# Patient Record
Sex: Female | Born: 1985
Health system: Southern US, Community
[De-identification: ages and names within clinical notes are randomized; demographics above are authoritative.]

## PROBLEM LIST (undated history)

## (undated) ENCOUNTER — Inpatient Hospital Stay (HOSPITAL_COMMUNITY): Payer: 59

## (undated) DIAGNOSIS — R519 Headache, unspecified: Secondary | ICD-10-CM

## (undated) DIAGNOSIS — E559 Vitamin D deficiency, unspecified: Secondary | ICD-10-CM

## (undated) DIAGNOSIS — R51 Headache: Secondary | ICD-10-CM

## (undated) DIAGNOSIS — E039 Hypothyroidism, unspecified: Secondary | ICD-10-CM

## (undated) DIAGNOSIS — R253 Fasciculation: Secondary | ICD-10-CM

## (undated) HISTORY — DX: Hypothyroidism, unspecified: E03.9

## (undated) HISTORY — DX: Headache: R51

## (undated) HISTORY — DX: Fasciculation: R25.3

## (undated) HISTORY — DX: Vitamin D deficiency, unspecified: E55.9

## (undated) HISTORY — DX: Headache, unspecified: R51.9

## (undated) HISTORY — PX: OTHER SURGICAL HISTORY: SHX169

---

## 2012-09-04 ENCOUNTER — Encounter (HOSPITAL_COMMUNITY): Payer: Self-pay | Admitting: Emergency Medicine

## 2012-09-04 ENCOUNTER — Emergency Department (INDEPENDENT_AMBULATORY_CARE_PROVIDER_SITE_OTHER)
Admission: EM | Admit: 2012-09-04 | Discharge: 2012-09-04 | Disposition: A | Discharge status: Home / Self Care | Payer: 59 | Source: Home / Self Care | Attending: Family Medicine | Admitting: Family Medicine

## 2012-09-04 DIAGNOSIS — J069 Acute upper respiratory infection, unspecified: Secondary | ICD-10-CM

## 2012-09-04 MED ORDER — HYDROCOD POLST-CHLORPHEN POLST 10-8 MG/5ML PO LQCR: 5.0000 mL | Freq: Two times a day (BID) | ORAL | Status: DC

## 2012-09-04 MED ORDER — AZITHROMYCIN 250 MG PO TABS: ORAL_TABLET | ORAL | Status: DC

## 2012-09-04 NOTE — ED Provider Notes (Signed)
History     CSN: [id number]  Arrival date & time 09/04/12  1925   First MD Initiated Contact with Patient 09/04/12 1929      Chief Complaint  Patient presents with  . URI    (Consider location/radiation/quality/duration/timing/severity/associated sxs/prior treatment) Patient is a 26 y.o. female presenting with URI. The history is provided by the patient.  URI The primary symptoms include cough. Primary symptoms do not include fever, fatigue, sore throat, swollen glands or abdominal pain. The current episode started more than 1 week ago. This is a new problem. The problem has not changed since onset. The onset of the illness is associated with exposure to sick contacts. Symptoms associated with the illness include congestion and rhinorrhea.    No past medical history on file.  History reviewed. No pertinent past surgical history.  No family history on file.  History  Substance Use Topics  . Smoking status: Not on file  . Smokeless tobacco: Not on file  . Alcohol Use: Not on file    OB History    Grav Para Term Preterm Abortions TAB SAB Ect Mult Living                  Review of Systems  Constitutional: Negative.  Negative for fever and fatigue.  HENT: Positive for congestion and rhinorrhea. Negative for sore throat and trouble swallowing.   Respiratory: Positive for cough.   Gastrointestinal: Negative.  Negative for abdominal pain.  Skin: Negative.     Allergies  Peanuts  Home Medications   Current Outpatient Rx  Name  Route  Sig  Dispense  Refill  . AZITHROMYCIN 250 MG PO TABS      Take as directed on pack   6 each   0   . HYDROCOD POLST-CPM POLST ER 10-8 MG/5ML PO LQCR   Oral   Take 5 mLs by mouth every 12 (twelve) hours.   115 mL   0     BP 137/70  Pulse 99  Temp 98.9 F (37.2 C) (Oral)  Resp 16  SpO2 99%  Physical Exam  Nursing note and vitals reviewed. Constitutional: She is oriented to person, place, and time. She appears  well-developed and well-nourished.  HENT:  Head: Normocephalic.  Right Ear: External ear normal.  Left Ear: External ear normal.  Mouth/Throat: Oropharynx is clear and moist.  Eyes: Pupils are equal, round, and reactive to light.  Neck: Normal range of motion. Neck supple.  Cardiovascular: Normal rate, regular rhythm, normal heart sounds and intact distal pulses.   Pulmonary/Chest: Breath sounds normal.  Lymphadenopathy:    She has no cervical adenopathy.  Neurological: She is alert and oriented to person, place, and time.  Skin: Skin is warm and dry.    ED Course  Procedures (including critical care time)  Labs Reviewed - No data to display No results found.   1. URI (upper respiratory infection)       MDM          Linna Hoff, MD 09/04/12 2004

## 2012-09-04 NOTE — ED Notes (Signed)
Pt c/o cough, stuffy nose, yellow mucus, weakness and night sweats.

## 2013-06-02 ENCOUNTER — Emergency Department (HOSPITAL_COMMUNITY)
Admission: EM | Admit: 2013-06-02 | Discharge: 2013-06-03 | Disposition: A | Discharge status: Home / Self Care | Payer: 59 | Attending: Emergency Medicine | Admitting: Emergency Medicine

## 2013-06-02 ENCOUNTER — Encounter (HOSPITAL_COMMUNITY): Payer: Self-pay

## 2013-06-02 DIAGNOSIS — R209 Unspecified disturbances of skin sensation: Secondary | ICD-10-CM | POA: Insufficient documentation

## 2013-06-02 DIAGNOSIS — Y9389 Activity, other specified: Secondary | ICD-10-CM | POA: Insufficient documentation

## 2013-06-02 DIAGNOSIS — Y929 Unspecified place or not applicable: Secondary | ICD-10-CM | POA: Insufficient documentation

## 2013-06-02 DIAGNOSIS — J069 Acute upper respiratory infection, unspecified: Secondary | ICD-10-CM | POA: Insufficient documentation

## 2013-06-02 DIAGNOSIS — W268XXA Contact with other sharp object(s), not elsewhere classified, initial encounter: Secondary | ICD-10-CM | POA: Insufficient documentation

## 2013-06-02 DIAGNOSIS — Z79899 Other long term (current) drug therapy: Secondary | ICD-10-CM | POA: Insufficient documentation

## 2013-06-02 DIAGNOSIS — S61211A Laceration without foreign body of left index finger without damage to nail, initial encounter: Secondary | ICD-10-CM

## 2013-06-02 DIAGNOSIS — S61209A Unspecified open wound of unspecified finger without damage to nail, initial encounter: Secondary | ICD-10-CM | POA: Insufficient documentation

## 2013-06-02 NOTE — ED Notes (Addendum)
i inch laceration left hand index laceration accidental. Bleeding controlled.  Pt also c/o of cold for 1 week been on nyquil for 1 week. Denies fever

## 2013-06-02 NOTE — ED Provider Notes (Signed)
History  This chart was scribed for non-physician practitioner, Katie  working with Vida Roller, MD by Ardeen Jourdain, ED Scribe. This patient was seen in room WTR8/WTR8 and the patient's care was started at 2257.  CSN: [id number]     Arrival date & time 06/02/13  2225  None    Chief Complaint  Patient presents with  . Laceration    left hand index finger   The history is provided by the patient. No language interpreter was used.    HPI Comments: Megan Hall is a 27 y.o. female who presents to the Emergency Department complaining of a laceration to the left index finger. Pt states she put her blender in the sink to wash it when she accidentally cut herself with the blade. She reports mild numbness and a "cold" sensation after the accident; both of which have resolved. She denies any weakness, numbness and tingling as associated symptoms. She denies any other injuries. Bleeding is controlled at this time.  Tetanus up to date.    History reviewed. No pertinent past medical history. History reviewed. No pertinent past surgical history. No family history on file.  History  Substance Use Topics  . Smoking status: Not on file  . Smokeless tobacco: Not on file  . Alcohol Use: No   No OB history available.   Review of Systems  All other systems reviewed and are negative.    Allergies  Peanuts  Home Medications   Current Outpatient Rx  Name  Route  Sig  Dispense  Refill  . PRESCRIPTION MEDICATION   Oral   Take 1 tablet by mouth daily. Oral Contraceptive tablet (pt unsure which one)         . Pseudoeph-Doxylamine-DM-APAP 60-12.03-22-999 MG/30ML LIQD   Oral   Take 30 mLs by mouth every 6 (six) hours as needed (for cold symptoms).          Triage Vitals: BP 132/78  Pulse 96  Temp(Src) 98.2 F (36.8 C) (Oral)  Resp 17  Ht 5\' 8"  (1.727 m)  Wt 160 lb (72.576 kg)  BMI 24.33 kg/m2  SpO2 99%  LMP 05/04/2013  Physical Exam  Musculoskeletal:  1.5cm  horizontal, subq, bleeding lac just distal to DIP joint on medial surface of L index finger.  No foreign body.  Mildly ttp.  Distal sensation intact.  5/5 flexion/extension strength.     ED Course   Procedures (including critical care time)  LACERATION REPAIR Performed by: Otilio Miu Authorized by: Ruby Cola E Consent: Verbal consent obtained. Risks and benefits: risks, benefits and alternatives were discussed Consent given by: patient Patient identity confirmed: provided demographic data Prepped and Draped in normal sterile fashion Wound explored  Laceration Location: L index finger  Laceration Length: 1.5cm  No Foreign Bodies seen or palpated  Anesthesia: digital block  Local anesthetic: lidocaine 2% w/out epinephrine  Anesthetic total: 4 ml  Irrigation method: syringe Amount of cleaning: standard  Skin closure: prolene 4.0  Number of sutures: 3  Technique: simple interrupted  Patient tolerance: Patient tolerated the procedure well with no immediate complications.  DIAGNOSTIC STUDIES: Oxygen Saturation is 99% on room air, normal by my interpretation.    COORDINATION OF CARE:  11:31 PM-Discussed treatment plan which includes laceration repair with pt at bedside and pt agreed to plan.    Labs Reviewed - No data to display No results found. 1. Laceration of left index finger w/o foreign body w/o damage to nail, initial encounter  MDM  26yo healthy F presents w/ lac to L index finger.  Cleaned and sutured.  Tetanus up to date.  Signs of wound infection discussed.  12:01 AM    I personally performed the services described in this documentation, which was scribed in my presence. The recorded information has been reviewed and is accurate.    Otilio Miu, PA-C 06/03/13 0002

## 2013-06-03 NOTE — ED Notes (Signed)
MD at bedside. EDPA has returned to evaluate pt for cold like symptoms VSS. Pt reports symptoms have improved. Offerred HHN- Pt declined at this time.

## 2013-06-03 NOTE — ED Provider Notes (Signed)
Medical screening examination/treatment/procedure(s) were performed by non-physician practitioner and as supervising physician I was immediately available for consultation/collaboration.    Vida Roller, MD 06/03/13 [phone]

## 2013-07-11 ENCOUNTER — Other Ambulatory Visit (HOSPITAL_COMMUNITY): Payer: Self-pay | Admitting: Obstetrics and Gynecology

## 2013-07-11 ENCOUNTER — Ambulatory Visit (HOSPITAL_COMMUNITY)
Admission: RE | Admit: 2013-07-11 | Discharge: 2013-07-11 | Disposition: A | Discharge status: Home / Self Care | Payer: 59 | Source: Ambulatory Visit | Attending: Obstetrics and Gynecology | Admitting: Obstetrics and Gynecology

## 2013-07-11 DIAGNOSIS — R51 Headache: Secondary | ICD-10-CM | POA: Insufficient documentation

## 2013-07-11 DIAGNOSIS — R519 Headache, unspecified: Secondary | ICD-10-CM

## 2013-09-30 ENCOUNTER — Other Ambulatory Visit: Payer: Self-pay | Admitting: Family Medicine

## 2013-09-30 DIAGNOSIS — E049 Nontoxic goiter, unspecified: Secondary | ICD-10-CM

## 2013-10-02 ENCOUNTER — Ambulatory Visit
Admission: RE | Admit: 2013-10-02 | Discharge: 2013-10-02 | Disposition: A | Discharge status: Home / Self Care | Payer: 59 | Source: Ambulatory Visit | Attending: Family Medicine | Admitting: Family Medicine

## 2013-10-02 DIAGNOSIS — E049 Nontoxic goiter, unspecified: Secondary | ICD-10-CM

## 2013-10-04 ENCOUNTER — Encounter: Payer: Self-pay | Admitting: Neurology

## 2013-10-04 ENCOUNTER — Ambulatory Visit: Payer: 59 | Admitting: Neurology

## 2013-10-22 ENCOUNTER — Other Ambulatory Visit: Payer: Self-pay | Admitting: Family Medicine

## 2013-10-22 DIAGNOSIS — E041 Nontoxic single thyroid nodule: Secondary | ICD-10-CM

## 2013-10-25 ENCOUNTER — Encounter (HOSPITAL_COMMUNITY): Payer: Self-pay | Admitting: Emergency Medicine

## 2013-10-25 ENCOUNTER — Emergency Department (HOSPITAL_COMMUNITY)
Admission: EM | Admit: 2013-10-25 | Discharge: 2013-10-25 | Disposition: A | Discharge status: Home / Self Care | Payer: 59 | Attending: Emergency Medicine | Admitting: Emergency Medicine

## 2013-10-25 DIAGNOSIS — R5383 Other fatigue: Secondary | ICD-10-CM

## 2013-10-25 DIAGNOSIS — R519 Headache, unspecified: Secondary | ICD-10-CM

## 2013-10-25 DIAGNOSIS — E041 Nontoxic single thyroid nodule: Secondary | ICD-10-CM | POA: Insufficient documentation

## 2013-10-25 DIAGNOSIS — R5381 Other malaise: Secondary | ICD-10-CM | POA: Insufficient documentation

## 2013-10-25 DIAGNOSIS — R200 Anesthesia of skin: Secondary | ICD-10-CM

## 2013-10-25 DIAGNOSIS — R6884 Jaw pain: Secondary | ICD-10-CM | POA: Insufficient documentation

## 2013-10-25 DIAGNOSIS — R209 Unspecified disturbances of skin sensation: Secondary | ICD-10-CM | POA: Insufficient documentation

## 2013-10-25 DIAGNOSIS — F411 Generalized anxiety disorder: Secondary | ICD-10-CM | POA: Insufficient documentation

## 2013-10-25 DIAGNOSIS — R51 Headache: Secondary | ICD-10-CM | POA: Insufficient documentation

## 2013-10-25 MED ORDER — HYDROCODONE-ACETAMINOPHEN 5-325 MG PO TABS: ORAL_TABLET | ORAL | Status: DC

## 2013-10-25 NOTE — Progress Notes (Signed)
   CARE MANAGEMENT ED NOTE 10/25/2013  Patient:  Megan Hall,Megan Hall   Account Number:  [id number]  Date Initiated:  10/25/2013  Documentation initiated by:  Edd ArbourGIBBS,KIMBERLY  Subjective/Objective Assessment:   28 yr [facility] with c/o right side facial numbness going on for months, comes and goes increases s/s last night and this am including arm weakness Pt states pcp is Vyvyan sun     Subjective/Objective Assessment Detail:     Action/Plan:   Action/Plan Detail:   EPIC updated   Anticipated DC Date:       Status Recommendation to Physician:   Result of Recommendation:    Other ED Services  Consult Working Plan    DC Planning Services  Other  PCP issues    Choice offered to / List presented to:            Status of service:  Completed, signed off  ED Comments:   ED Comments Detail:

## 2013-10-25 NOTE — ED Notes (Signed)
Pt states that she has had intermittent facial numbness and right weakness/numbness for several months her PCP is currently running test thinking this is thyroid related. Pt states last night when she was trying to go to sleep she was having right face and arm numbness that has continued (intermittently) on to this morning and she was aggravated with arm weakness and not able to go to work.

## 2013-10-25 NOTE — ED Provider Notes (Signed)
CSN: [id number]     Arrival date & time 10/25/13  1107 History   First MD Initiated Contact with Patient 10/25/13 1118     Chief Complaint  Patient presents with  . facial numbness    (Consider location/radiation/quality/duration/timing/severity/associated sxs/prior Treatment) HPI Pt is a 28yo female with no significant PMH presenting with right facial numbness and right arm numbness x38mo.  Pt has also had associated weakness in right arm. Today, pt has right facial numbness, right jaw pain that is aching 7/10, right arm numbness and weakness. She has not taken any pain medications as she is scared to take most medications.  Symptoms have been intermittent and started shortly after pt was placed on a new birth control by her GYN.  Pt states when symptoms initially started, she was also having severe headaches.  Her GYN ordered a head CT at that time which was normal per pt.  She is not being followed by her PCP for symptoms.  PCP believes it is due to her thyroid as pt has also c/o fatigue.  Per pt, blood work for her thyroid was also normal but they found nodules on her thyroid, she is scheduled for a biopsy of the nodules.  PCP also referred pt to neurology however pt states she skipped her appointment last week as she thought her symptoms would "just go away."  Denies fever, n/v/d. Denies known injury. Pt does admit to using [employer] to look up her symptoms and was concerned her symptoms are due to something dangerous.   History reviewed. No pertinent past medical history. History reviewed. No pertinent past surgical history. No family history on file. History  Substance Use Topics  . Smoking status: Never Smoker   . Smokeless tobacco: Not on file  . Alcohol Use: No   OB History   Grav Para Term Preterm Abortions TAB SAB Ect Mult Living                 Review of Systems  Constitutional: Negative for fever and chills.  Gastrointestinal: Positive for nausea. Negative for vomiting.   Neurological: Positive for weakness ( right arm), numbness ( right side of face, right arm) and headaches. Negative for dizziness and light-headedness.  All other systems reviewed and are negative.    Allergies  Peanuts  Home Medications   Current Outpatient Rx  Name  Route  Sig  Dispense  Refill  . HYDROcodone-acetaminophen (NORCO/VICODIN) 5-325 MG per tablet      Take 1-2 tablets every 4-6 hours as needed for pain.   6 tablet   0    BP 127/69  Pulse 89  Temp(Src) 97.9 F (36.6 C) (Oral)  Resp 19  SpO2 100%  LMP 10/24/2013 Physical Exam  Nursing note and vitals reviewed. Constitutional: She is oriented to person, place, and time. She appears well-developed and well-nourished. No distress.  Pt lying comfortably in exam bed, NAD.   HENT:  Head: Normocephalic and atraumatic.  Mouth/Throat: Oropharynx is clear and moist. No oropharyngeal exudate.  Eyes: Conjunctivae and EOM are normal. Pupils are equal, round, and reactive to light. No scleral icterus.  Neck: Normal range of motion. Neck supple.  Cardiovascular: Normal rate, regular rhythm and normal heart sounds.   Pulmonary/Chest: Effort normal and breath sounds normal. No respiratory distress. She has no wheezes. She has no rales. She exhibits no tenderness.  Abdominal: Soft. Bowel sounds are normal. She exhibits no distension and no mass. There is no tenderness. There is no rebound and  no guarding.  Musculoskeletal: Normal range of motion. She exhibits no edema and no tenderness.  Neurological: She is alert and oriented to person, place, and time. She has normal strength. No cranial nerve deficit or sensory deficit. She displays a negative Romberg sign. Coordination and gait normal. GCS eye subscore is 4. GCS verbal subscore is 5. GCS motor subscore is 6.  CN II-XII in tact, no focal deficit, nl finger to nose coordination. Nl sensation, 5/5 strength in all major muscle groups. Neg romberg and nl gait.  Skin: Skin is warm  and dry. She is not diaphoretic.  Psychiatric: Her mood appears anxious.    ED Course  Procedures (including critical care time) Labs Review Labs Reviewed - No data to display Imaging Review No results found.  EKG Interpretation   None       MDM   1. Right facial numbness   2. Right arm numbness   3. Headache    Pt c/o intermittent right facial numbness and weakness. Pt currently being followed by PCP for possible thyroid related problems.  No significant PMH.  Pt has not f/u with neurology as she believed symptoms would go away. Denies fever, n/v/d. Denies known injury. On exam, pt appears well, NAD. Normal neuro exam. No evidence of right sided weakness.  Pt initially declined pain medication but requested pain medication to go home with, Rx: norco.   Not concerned for CVA, SAH, TIA unlikely as pt has been followed by 2 other providers over last 38mo, reports negative head CT 38mo ago.  Do not believe emergent process taking place at this time. Advised pt to f/u with neurology as previously discussed with her PCP.  Provided another referral today to [facility].  Discussed return precautions.  Pt verbalized understanding and agreement with tx plan.    Junius FinnerErin O'Malley, PA-C 10/26/13 [phone]

## 2013-10-28 NOTE — ED Provider Notes (Signed)
Medical screening examination/treatment/procedure(s) were performed by non-physician practitioner and as supervising physician I was immediately available for consultation/collaboration.  EKG Interpretation   None         Jagger Beahm L Tobie Hellen, MD 10/28/13 0734 

## 2013-11-05 ENCOUNTER — Inpatient Hospital Stay
Admission: RE | Admit: 2013-11-05 | Discharge: 2013-11-05 | Disposition: A | Discharge status: Home / Self Care | Payer: 59 | Source: Ambulatory Visit | Attending: Family Medicine | Admitting: Family Medicine

## 2013-11-13 ENCOUNTER — Inpatient Hospital Stay: Admission: RE | Admit: 2013-11-13 | Payer: 59 | Source: Ambulatory Visit

## 2013-11-20 ENCOUNTER — Other Ambulatory Visit (HOSPITAL_COMMUNITY)
Admission: RE | Admit: 2013-11-20 | Discharge: 2013-11-20 | Disposition: A | Discharge status: Home / Self Care | Payer: 59 | Source: Ambulatory Visit | Attending: Interventional Radiology | Admitting: Interventional Radiology

## 2013-11-20 ENCOUNTER — Ambulatory Visit
Admission: RE | Admit: 2013-11-20 | Discharge: 2013-11-20 | Disposition: A | Discharge status: Home / Self Care | Payer: 59 | Source: Ambulatory Visit | Attending: Family Medicine | Admitting: Family Medicine

## 2013-11-20 DIAGNOSIS — E049 Nontoxic goiter, unspecified: Secondary | ICD-10-CM | POA: Insufficient documentation

## 2013-11-20 DIAGNOSIS — E041 Nontoxic single thyroid nodule: Secondary | ICD-10-CM

## 2013-12-02 ENCOUNTER — Ambulatory Visit: Payer: 59 | Admitting: Neurology

## 2013-12-17 ENCOUNTER — Encounter: Payer: Self-pay | Admitting: Neurology

## 2013-12-17 DIAGNOSIS — E559 Vitamin D deficiency, unspecified: Secondary | ICD-10-CM

## 2013-12-18 ENCOUNTER — Encounter: Payer: Self-pay | Admitting: Neurology

## 2013-12-18 ENCOUNTER — Ambulatory Visit (INDEPENDENT_AMBULATORY_CARE_PROVIDER_SITE_OTHER): Payer: 59 | Admitting: Neurology

## 2013-12-18 ENCOUNTER — Encounter (INDEPENDENT_AMBULATORY_CARE_PROVIDER_SITE_OTHER): Payer: Self-pay

## 2013-12-18 VITALS — BP 113/78 | HR 82 | Ht 68.0 in | Wt 172.0 lb

## 2013-12-18 DIAGNOSIS — G518 Other disorders of facial nerve: Secondary | ICD-10-CM

## 2013-12-18 DIAGNOSIS — R253 Fasciculation: Secondary | ICD-10-CM

## 2013-12-18 DIAGNOSIS — G5139 Clonic hemifacial spasm, unspecified: Secondary | ICD-10-CM | POA: Insufficient documentation

## 2013-12-18 DIAGNOSIS — R259 Unspecified abnormal involuntary movements: Secondary | ICD-10-CM

## 2013-12-18 NOTE — Progress Notes (Signed)
PATIENT: Megan Hall DOB: 01-10-1986  HISTORICAL  Megan Hall is a 28 years old [place] female, referred by her primary care physician Dr. Wynelle Link for evaluation of facial muscle spasm,  Over past 1 year, without clear trigger event, she noticed right more than left eye twitching, its induced when she trying to close her eyes, she has no involuntary muscle twitching otherwise, over the years, it is getting worse, she work at [employer], is causing mild difficulty for her, she also felt heavy of her right face, she denies dysarthria, no dysphagia, no visual loss, no hearing loss, sometimes she felt right ear fullness sensation,  In addition she felt intermittent muscle twitching involving her limb muscles, no weakness, she is concerned about the possibility of multiple sclerosis, ALS with her symptoms,     Laboratory evaluation in December third 2014 showed normal BMP, TSH, vitamin D,   ultrasound of thyroid in December  2014 showed right lower lobe complex mixed cystic and solid lesions, she underwent thyroid complex nodule needle aspiration in November 20 2012 was told that was a benign lesion   REVIEW OF SYSTEMS: Full 14 system review of systems performed and notable only for confusion, headache, numbness, weakness  ALLERGIES: Allergies  Allergen Reactions  . Peanuts [Peanut Oil] Swelling    HOME MEDICATIONS: Current Outpatient Prescriptions on File Prior to Visit  Medication Sig Dispense Refill  . HYDROcodone-acetaminophen (NORCO/VICODIN) 5-325 MG per tablet Take 1-2 tablets every 4-6 hours as needed for pain.  6 tablet  0     PAST MEDICAL HISTORY: Past Medical History  Diagnosis Date  . Hypothyroidism   . Vitamin D deficiency   . HA (headache)     PAST SURGICAL HISTORY: Past Surgical History  Procedure Laterality Date  . None      FAMILY HISTORY: Family History  Problem Relation Age of Onset  . High blood pressure Mother     SOCIAL  HISTORY:  History   Social History  . Marital Status: Single    Spouse Name: N/A    Number of Children: 0  . Years of Education: 13   Occupational History    [employer]   Social History Main Topics  . Smoking status: Never Smoker   . Smokeless tobacco: Never Used  . Alcohol Use: 0.6 oz/week    1 Glasses of wine per week     Comment: OCC   . Drug Use: No  . Sexual Activity: Not on file   Other Topics Concern  . Not on file   Social History Narrative   Patient lives at home alone single. Patient works full time at [employer].   Left handed   Education some college   Caffeine none    PHYSICAL EXAM   Filed Vitals:   12/18/13 0934  BP: 113/78  Pulse: 82  Height: 5\' 8"  (1.727 m)  Weight: 172 lb (78.019 kg)   Body mass index is 26.16 kg/(m^2).   Generalized: In no acute distress  Neck: Supple, no carotid bruits   Cardiac: Regular rate rhythm  Pulmonary: Clear to auscultation bilaterally  Musculoskeletal: No deformity  Neurological examination  Mentation: Alert oriented to time, place, history taking, and causual conversation  Cranial nerve II-XII: Pupils were equal round reactive to light. Extraocular movements were full.  Visual field were full on confrontational test. Bilateral fundi were sharp.  Facial sensation and strength were normal. Hearing was intact to finger rubbing bilaterally. Uvula tongue midline.  Head turning and shoulder shrug and were normal and symmetric.Tongue protrusion into cheek strength was normal.  When she closed her eyes, there was orbicularis oculi muscle spasm  Motor: Normal tone, bulk and strength.  Sensory: Intact to fine touch, pinprick, preserved vibratory sensation, and proprioception at toes.  Coordination: Normal finger to nose, heel-to-shin bilaterally there was no truncal ataxia  Gait: Rising up from seated position without assistance, normal stance, without trunk ataxia, moderate stride, good arm swing, smooth  turning, able to perform tiptoe, and heel walking without difficulty.   Romberg signs: Negative  Deep tendon reflexes: Brachioradialis 2/2, biceps 2/2, triceps 2/2, patellar 2/2, Achilles 2/2, plantar responses were flexor bilaterally.   DIAGNOSTIC DATA (LABS, IMAGING, TESTING) - I reviewed patient records, labs, notes, testing and imaging myself where available.   ASSESSMENT AND PLAN  Megan Hall is a 28 y.o. female complains of  a year history of movements induced bilateral facial muscle spasm, right worse than left she also complains of random limb muscle fasciculations, essentially normal neurological examination, including normal reflexes, no weakness,  1. most consistent with benign muscle fasciculation, 2. but patient concerned about the possibility of multiple sclerosis, ALS, she wants to proceed with thorough extensive evaluation, we will proceed with MRI of the brain with and without contrast, EMG nerve conduction study     Levert FeinsteinYijun Sanjiv Castorena, M.D. Ph.D.  [facility] [address], [address] [place], [place] [address] [phone]

## 2013-12-25 ENCOUNTER — Other Ambulatory Visit: Payer: 59

## 2013-12-31 ENCOUNTER — Encounter: Payer: 59 | Admitting: Neurology

## 2016-05-30 ENCOUNTER — Emergency Department (HOSPITAL_COMMUNITY)
Admission: EM | Admit: 2016-05-30 | Discharge: 2016-05-30 | Disposition: A | Discharge status: Home / Self Care | Payer: 59 | Attending: Emergency Medicine | Admitting: Emergency Medicine

## 2016-05-30 ENCOUNTER — Encounter (HOSPITAL_COMMUNITY): Payer: Self-pay | Admitting: Emergency Medicine

## 2016-05-30 ENCOUNTER — Emergency Department (HOSPITAL_COMMUNITY): Payer: 59

## 2016-05-30 DIAGNOSIS — R072 Precordial pain: Secondary | ICD-10-CM | POA: Diagnosis not present

## 2016-05-30 DIAGNOSIS — R0789 Other chest pain: Secondary | ICD-10-CM

## 2016-05-30 DIAGNOSIS — Z79899 Other long term (current) drug therapy: Secondary | ICD-10-CM | POA: Insufficient documentation

## 2016-05-30 DIAGNOSIS — E039 Hypothyroidism, unspecified: Secondary | ICD-10-CM | POA: Insufficient documentation

## 2016-05-30 DIAGNOSIS — Z7982 Long term (current) use of aspirin: Secondary | ICD-10-CM | POA: Insufficient documentation

## 2016-05-30 LAB — CBC
HEMATOCRIT: 36.3 % (ref 36.0–46.0)
Hemoglobin: 12.1 g/dL (ref 12.0–15.0)
MCH: 29.9 pg (ref 26.0–34.0)
MCHC: 33.3 g/dL (ref 30.0–36.0)
MCV: 89.6 fL (ref 78.0–100.0)
PLATELETS: 289 10*3/uL (ref 150–400)
RBC: 4.05 MIL/uL (ref 3.87–5.11)
RDW: 14.7 % (ref 11.5–15.5)
WBC: 6.5 10*3/uL (ref 4.0–10.5)

## 2016-05-30 LAB — BASIC METABOLIC PANEL
Anion gap: 6 (ref 5–15)
BUN: 9 mg/dL (ref 6–20)
CHLORIDE: 103 mmol/L (ref 101–111)
CO2: 27 mmol/L (ref 22–32)
CREATININE: 0.76 mg/dL (ref 0.44–1.00)
Calcium: 9.3 mg/dL (ref 8.9–10.3)
GFR calc Af Amer: >60 mL/min (ref >60)
GFR calc non Af Amer: >60 mL/min (ref >60)
GLUCOSE: 93 mg/dL (ref 65–99)
POTASSIUM: 4.3 mmol/L (ref 3.5–5.1)
SODIUM: 136 mmol/L (ref 135–145)

## 2016-05-30 LAB — I-STAT TROPONIN, ED: Troponin i, poc: 0 ng/mL (ref 0.00–0.08)

## 2016-05-30 NOTE — ED Notes (Signed)
No answer x1

## 2016-05-30 NOTE — ED Provider Notes (Signed)
[facility]-EMERGENCY DEPT Provider Note   CSN: [id number] Arrival date & time: 05/30/16  1103  First Provider Contact:  First MD Initiated Contact with Patient 05/30/16 1309        History   Chief Complaint Chief Complaint  Patient presents with  . Chest Pain    HPI Megan Hall is a 30 y.o. female.  Patient c/o pain across upper chest for the past 4-5 days. States occurs at rest. Lasts 1-2 seconds per episode. No more prolonged or persistent chest pain. No exertional cp. No associated sob, nv or diaphoresis. Rare, non prod cough. No other uri c/o. No fever or chills. No leg pain or swelling. No chest wall injury/strain, although states did recently start working out.  No immobility, trauma, surgery. No hx dvt or pe. No fam hx premature cad. Non smoker. No palpitations.  Patient does note recent death of aunt in [place], and stress related to that, and not being able to attend funeral.      The history is provided by the patient.  Chest Pain   Pertinent negatives include no abdominal pain, no back pain, no fever, no headaches, no palpitations, no shortness of breath and no vomiting.    Past Medical History:  Diagnosis Date  . HA (headache)   . Hypothyroidism   . Vitamin D deficiency     Patient Active Problem List   Diagnosis Date Noted  . Fasciculations 12/18/2013  . Facial spasm 12/18/2013  . Vitamin D deficiency     Past Surgical History:  Procedure Laterality Date  . None      OB History    No data available       Home Medications    Prior to Admission medications   Medication Sig Start Date End Date Taking? Authorizing Provider  aspirin EC 81 MG tablet Take 81 mg by mouth once.   Yes Historical Provider, MD  Cholecalciferol (VITAMIN D PO) Take 3 drops by mouth 3 (three) times a week.   Yes Historical Provider, MD  FOLIC ACID-B6-B12-D PO Take by mouth 3 (three) times a week. A dropper full three times weekly.   Yes Historical Provider, MD  Multiple  Vitamins-Minerals (MULTIVITAMINS/MINERALS ADULT) LIQD Take by mouth 3 (three) times a week. Take one capful three times weekly.   Yes Historical Provider, MD  simethicone (MYLICON) 125 MG chewable tablet Chew 125 mg by mouth every 6 (six) hours as needed for flatulence.   Yes Historical Provider, MD  HYDROcodone-acetaminophen (NORCO/VICODIN) 5-325 MG per tablet Take 1-2 tablets every 4-6 hours as needed for pain. Patient not taking: Reported on 05/30/2016 10/25/13   Junius Finner, PA-C    Family History Family History  Problem Relation Age of Onset  . High blood pressure Mother     Social History Social History  Substance Use Topics  . Smoking status: Never Smoker  . Smokeless tobacco: Never Used  . Alcohol use 0.6 oz/week    1 Glasses of wine per week     Comment: OCC      Allergies   Peanuts [peanut oil]   Review of Systems Review of Systems  Constitutional: Negative for chills and fever.  HENT: Negative for sore throat.   Eyes: Negative for redness.  Respiratory: Negative for shortness of breath.   Cardiovascular: Positive for chest pain. Negative for palpitations and leg swelling.  Gastrointestinal: Negative for abdominal pain and vomiting.  Genitourinary: Negative for flank pain.  Musculoskeletal: Negative for back pain and neck pain.  Skin: Negative for rash.  Neurological: Negative for headaches.  Hematological: Does not bruise/bleed easily.  Psychiatric/Behavioral: Negative for confusion.     Physical Exam Updated Vital Signs BP 118/91   Pulse 87   Temp 98.3 F (36.8 C) (Oral)   Resp 15   Ht  (1.727 m)   Wt 89.8 kg   LMP 05/16/2016   SpO2 100%   BMI 30.11 kg/m   Physical Exam  Constitutional: She appears well-developed and well-nourished. No distress.  HENT:  Head: Atraumatic.  Eyes: Conjunctivae are normal. No scleral icterus.  Neck: Neck supple. No tracheal deviation present.  Cardiovascular: Normal rate, regular rhythm, normal heart sounds  and intact distal pulses.  Exam reveals no gallop and no friction rub.   No murmur heard. Pulmonary/Chest: Effort normal. No respiratory distress. She exhibits tenderness.  Abdominal: Soft. Normal appearance. She exhibits no distension. There is no tenderness.  Musculoskeletal: She exhibits no edema or tenderness.  Neurological: She is alert.  Skin: Skin is warm and dry. No rash noted.  Psychiatric: She has a normal mood and affect.  Nursing note and vitals reviewed.    ED Treatments / Results  Labs (all labs ordered are listed, but only abnormal results are displayed)  Results for orders placed or performed during the hospital encounter of 05/30/16  Basic metabolic panel  Result Value Ref Range   Sodium 136 135 - 145 mmol/L   Potassium 4.3 3.5 - 5.1 mmol/L   Chloride 103 101 - 111 mmol/L   CO2 27 22 - 32 mmol/L   Glucose, Bld 93 65 - 99 mg/dL   BUN 9 6 - 20 mg/dL   Creatinine, Ser 1.61 0.44 - 1.00 mg/dL   Calcium 9.3 8.9 - 09.6 mg/dL   GFR calc non Af Amer >60 >60 mL/min   GFR calc Af Amer >60 >60 mL/min   Anion gap 6 5 - 15  CBC  Result Value Ref Range   WBC 6.5 4.0 - 10.5 K/uL   RBC 4.05 3.87 - 5.11 MIL/uL   Hemoglobin 12.1 12.0 - 15.0 g/dL   HCT 04.5 40.9 - 81.1 %   MCV 89.6 78.0 - 100.0 fL   MCH 29.9 26.0 - 34.0 pg   MCHC 33.3 30.0 - 36.0 g/dL   RDW 91.4 78.2 - 95.6 %   Platelets 289 150 - 400 K/uL  I-stat troponin, ED  Result Value Ref Range   Troponin i, poc 0.00 0.00 - 0.08 ng/mL   Comment 3           Dg Chest 2 View  Result Date: 05/30/2016 CLINICAL DATA:  Mid chest pain and shortness of breath for 5 days. EXAM: CHEST  2 VIEW COMPARISON:  None. FINDINGS: The heart size and mediastinal contours are within normal limits. Both lungs are clear. The visualized skeletal structures are unremarkable. IMPRESSION: No active cardiopulmonary disease. Electronically Signed   By: Kennith Center M.D.   On: 05/30/2016 12:52    EKG  EKG Interpretation  Date/Time:  Monday  May 30 2016 11:19:29 EDT Ventricular Rate:  108 PR Interval:    QRS Duration: 81 QT Interval:  332 QTC Calculation: 445 R Axis:   18 Text Interpretation:  Sinus tachycardia Low voltage, precordial leads RSR' in V1 or V2, probably normal variant Baseline wander in lead(s) V4 V6 No old tracing to compare Sinus tachycardia Confirmed by Erma Heritage MD, Sheria Lang [id number]) on 05/30/2016 11:27:29 AM Also confirmed by Erma Heritage MD, CAMERON [phone]), editor Valentina Lucks  CT, Jola BabinskiMarilyn ([id number])  on 05/30/2016 12:00:21 PM       Radiology Dg Chest 2 View  Result Date: 05/30/2016 CLINICAL DATA:  Mid chest pain and shortness of breath for 5 days. EXAM: CHEST  2 VIEW COMPARISON:  None. FINDINGS: The heart size and mediastinal contours are within normal limits. Both lungs are clear. The visualized skeletal structures are unremarkable. IMPRESSION: No active cardiopulmonary disease. Electronically Signed   By: Kennith CenterEric  Mansell M.D.   On: 05/30/2016 12:52    Procedures Procedures (including critical care time)  Medications Ordered in ED Medications - No data to display   Initial Impression / Assessment and Plan / ED Course  I have reviewed the triage vital signs and the nursing notes.  Pertinent labs & imaging results that were available during my care of the patient were reviewed by me and considered in my medical decision making (see chart for details).  Clinical Course    Labs sent. Cxr.  Patients symptoms felt not c/w ACS, w episodes cp each lasting only 1-2 seconds.  Patient had episode on monitor during my eval - no change on monitor, no pvc or dysrhythmia.   After symptoms for several days, trop 0. cxr neg.   Patient appears stable for d/c.     Final Clinical Impressions(s) / ED Diagnoses    New Prescriptions New Prescriptions   No medications on file     Cathren LaineKevin Lucyle Alumbaugh, MD 05/30/16 1327

## 2016-05-30 NOTE — ED Triage Notes (Signed)
Pt reports generalized CP, SOB and non-productive cough for the past 5 days. No lightheadedness or dizziness.

## 2016-05-30 NOTE — Discharge Instructions (Signed)
It was our pleasure to provide your ER care today - we hope that you feel better.  Sorry about the loss of your family member.  You may take tylenol/advil as need.  Return to ER if worse, new symptoms, trouble breathing, persistent/recurrent chest pain, persistent fast heart beat, fainting, other concern.

## 2016-05-30 NOTE — ED Notes (Signed)
Patient is A & O x4.  She understood discharge instructions. 

## 2016-12-14 ENCOUNTER — Emergency Department (HOSPITAL_COMMUNITY)
Admission: EM | Admit: 2016-12-14 | Discharge: 2016-12-14 | Disposition: A | Discharge status: Home / Self Care | Payer: 59 | Attending: Emergency Medicine | Admitting: Emergency Medicine

## 2016-12-14 ENCOUNTER — Encounter (HOSPITAL_COMMUNITY): Payer: Self-pay | Admitting: Emergency Medicine

## 2016-12-14 DIAGNOSIS — Z7982 Long term (current) use of aspirin: Secondary | ICD-10-CM | POA: Diagnosis not present

## 2016-12-14 DIAGNOSIS — J329 Chronic sinusitis, unspecified: Secondary | ICD-10-CM | POA: Insufficient documentation

## 2016-12-14 DIAGNOSIS — J3489 Other specified disorders of nose and nasal sinuses: Secondary | ICD-10-CM | POA: Diagnosis not present

## 2016-12-14 DIAGNOSIS — E039 Hypothyroidism, unspecified: Secondary | ICD-10-CM | POA: Diagnosis not present

## 2016-12-14 DIAGNOSIS — Z91010 Allergy to peanuts: Secondary | ICD-10-CM | POA: Diagnosis not present

## 2016-12-14 DIAGNOSIS — R51 Headache: Secondary | ICD-10-CM | POA: Diagnosis present

## 2016-12-14 DIAGNOSIS — H9201 Otalgia, right ear: Secondary | ICD-10-CM | POA: Diagnosis not present

## 2016-12-14 NOTE — ED Triage Notes (Signed)
Pt reports nasal discomfort and dryness and intermittent pain over last several month

## 2016-12-14 NOTE — Discharge Instructions (Signed)
You can use Flonase nasal spray which is avilable over the counter. Use nasal saline (you can try Arm and Hammer Simply Saline) at least 4 times a day, use saline 5-10 minutes before using the fluticasone (flonase) nasal spray ° °Do not use Afrin (Oxymetazoline) ° °Rest, wash hands frequently  and drink plenty of water. ° °You may try over the counter medication such as allegra-decongestant or claritin-decongestant and/or Mucinex or decongestants. ° °Push fluids to try to thin the mucus and get it to flow out the nose.  ° °

## 2016-12-15 NOTE — ED Provider Notes (Signed)
[facility] DEPT Provider Note   CSN: [id number] Arrival date & time: 12/14/16  2040     History   Chief Complaint Chief Complaint  Patient presents with  . Facial Pain   HPI   Blood pressure 144/98, pulse 96, temperature 98.5 F (36.9 C), temperature source Oral, resp. rate 16, height 5\' 8"  (1.727 m), weight 91.2 kg, last menstrual period 11/18/2016, SpO2 100 %.  Megan Hall is a 31 y.o. female complaining of nasal pressure and dryness with irritation and right ear pain onset 3 months ago. She denies fevers, chills, rhinorrhea, trauma or previous sinus infections. She states that the pain was severe last night and kept her from sleep. She took a muscle relaxer and was able to rest.  Past Medical History:  Diagnosis Date  . HA (headache)   . Hypothyroidism   . Vitamin D deficiency     Patient Active Problem List   Diagnosis Date Noted  . Fasciculations 12/18/2013  . Facial spasm 12/18/2013  . Vitamin D deficiency     Past Surgical History:  Procedure Laterality Date  . None      OB History    No data available       Home Medications    Prior to Admission medications   Medication Sig Start Date End Date Taking? Authorizing Provider  aspirin EC 81 MG tablet Take 81 mg by mouth once.    Historical Provider, MD  Cholecalciferol (VITAMIN D PO) Take 3 drops by mouth 3 (three) times a week.    Historical Provider, MD  FOLIC ACID-B6-B12-D PO Take by mouth 3 (three) times a week. A dropper full three times weekly.    Historical Provider, MD  HYDROcodone-acetaminophen (NORCO/VICODIN) 5-325 MG per tablet Take 1-2 tablets every 4-6 hours as needed for pain. Patient not taking: Reported on 05/30/2016 10/25/13   Junius FinnerErin O'Malley, PA-C  Multiple Vitamins-Minerals (MULTIVITAMINS/MINERALS ADULT) LIQD Take by mouth 3 (three) times a week. Take one capful three times weekly.    Historical Provider, MD  simethicone (MYLICON) 125 MG chewable tablet Chew 125 mg by mouth every 6  (six) hours as needed for flatulence.    Historical Provider, MD    Family History Family History  Problem Relation Age of Onset  . High blood pressure Mother     Social History Social History  Substance Use Topics  . Smoking status: Never Smoker  . Smokeless tobacco: Never Used  . Alcohol use 0.6 oz/week    1 Glasses of wine per week     Comment: OCC      Allergies   Peanuts [peanut oil]   Review of Systems Review of Systems  10 systems reviewed and found to be negative, except as noted in the HPI.   Physical Exam Updated Vital Signs BP 144/98 (BP Location: Right Arm)   Pulse 96   Temp 98.5 F (36.9 C) (Oral)   Resp 16   Ht 5\' 8"  (1.727 m)   Wt 91.2 kg   LMP 11/18/2016   SpO2 100%   BMI 30.56 kg/m   Physical Exam  Constitutional: She appears well-developed and well-nourished.  HENT:  Head: Normocephalic.  Right Ear: External ear normal.  Left Ear: External ear normal.  Mouth/Throat: Oropharynx is clear and moist. No oropharyngeal exudate.  No drooling or stridor. Posterior pharynx mildly erythematous no significant tonsillar hypertrophy. No exudate. Soft palate rises symmetrically. No TTP or induration under tongue.   No tenderness to palpation of frontal or bilateral  maxillary sinuses.  Mild mucosal edema in the nares with scant rhinorrhea.  Bilateral tympanic membranes with normal architecture and good light reflex.    Eyes: Conjunctivae and EOM are normal. Pupils are equal, round, and reactive to light.  Neck: Normal range of motion. Neck supple.  Cardiovascular: Normal rate and regular rhythm.   Pulmonary/Chest: Effort normal and breath sounds normal. No stridor. No respiratory distress. She has no wheezes. She has no rales. She exhibits no tenderness.  Abdominal: Soft. There is no tenderness. There is no rebound and no guarding.  Nursing note and vitals reviewed.    ED Treatments / Results  Labs (all labs ordered are listed, but only  abnormal results are displayed) Labs Reviewed - No data to display  EKG  EKG Interpretation None       Radiology No results found.  Procedures Procedures (including critical care time)  Medications Ordered in ED Medications - No data to display   Initial Impression / Assessment and Plan / ED Course  I have reviewed the triage vital signs and the nursing notes.  Pertinent labs & imaging results that were available during my care of the patient were reviewed by me and considered in my medical decision making (see chart for details).     Vitals:   12/14/16 2056 12/14/16 2323  BP: 128/89 144/98  Pulse: 103 96  Resp: 16 16  Temp: 98.5 F (36.9 C)   TempSrc: Oral   SpO2: 100% 100%  Weight: 91.2 kg   Height: 5\' 8"  (1.727 m)     Medications - No data to display  Megan Hall is 31 y.o. female presenting with Nasal pressure and dryness over the course of 3 months. No significant abnormality on physical exam., Patient advised Flonase and can follow with primary care or ENT.  Evaluation does not show pathology that would require ongoing emergent intervention or inpatient treatment. Pt is hemodynamically stable and mentating appropriately. Discussed findings and plan with patient/guardian, who agrees with care plan. All questions answered. Return precautions discussed and outpatient follow up given.      Final Clinical Impressions(s) / ED Diagnoses   Final diagnoses:  Sinus pressure    New Prescriptions Discharge Medication List as of 12/14/2016 11:11 PM       Wynetta Emery, PA-C 12/15/16 0020    April Palumbo, MD 12/15/16 [phone]

## 2017-01-18 DIAGNOSIS — J342 Deviated nasal septum: Secondary | ICD-10-CM | POA: Diagnosis not present

## 2017-01-18 DIAGNOSIS — J343 Hypertrophy of nasal turbinates: Secondary | ICD-10-CM | POA: Diagnosis not present

## 2017-01-18 DIAGNOSIS — J301 Allergic rhinitis due to pollen: Secondary | ICD-10-CM | POA: Diagnosis not present

## 2017-06-15 DIAGNOSIS — R253 Fasciculation: Secondary | ICD-10-CM | POA: Diagnosis not present

## 2017-06-15 DIAGNOSIS — R209 Unspecified disturbances of skin sensation: Secondary | ICD-10-CM | POA: Diagnosis not present

## 2017-06-22 ENCOUNTER — Ambulatory Visit (INDEPENDENT_AMBULATORY_CARE_PROVIDER_SITE_OTHER): Payer: Self-pay | Admitting: Neurology

## 2017-06-22 ENCOUNTER — Telehealth: Payer: Self-pay | Admitting: Neurology

## 2017-06-22 ENCOUNTER — Encounter: Payer: Self-pay | Admitting: Neurology

## 2017-06-22 ENCOUNTER — Encounter: Payer: Self-pay | Admitting: *Deleted

## 2017-06-22 VITALS — BP 138/70 | HR 109 | Ht 68.0 in | Wt 188.0 lb

## 2017-06-22 DIAGNOSIS — R51 Headache: Secondary | ICD-10-CM

## 2017-06-22 DIAGNOSIS — R519 Headache, unspecified: Secondary | ICD-10-CM

## 2017-06-22 DIAGNOSIS — R253 Fasciculation: Secondary | ICD-10-CM

## 2017-06-22 DIAGNOSIS — G43709 Chronic migraine without aura, not intractable, without status migrainosus: Secondary | ICD-10-CM

## 2017-06-22 DIAGNOSIS — G5139 Clonic hemifacial spasm, unspecified: Secondary | ICD-10-CM

## 2017-06-22 DIAGNOSIS — G513 Clonic hemifacial spasm: Secondary | ICD-10-CM

## 2017-06-22 DIAGNOSIS — IMO0002 Reserved for concepts with insufficient information to code with codable children: Secondary | ICD-10-CM | POA: Insufficient documentation

## 2017-06-22 MED ORDER — SUMATRIPTAN SUCCINATE 50 MG PO TABS
50.0000 mg | ORAL_TABLET | ORAL | 6 refills | Status: DC | PRN
Start: 2017-06-22 — End: 2017-06-22

## 2017-06-22 MED ORDER — NORTRIPTYLINE HCL 10 MG PO CAPS
10.0000 mg | ORAL_CAPSULE | Freq: Every day | ORAL | 6 refills | Status: DC
Start: 2017-06-22 — End: 2017-06-22

## 2017-06-22 NOTE — Telephone Encounter (Signed)
Dr. Terrace ArabiaYan will no longer be treating this patient.  MRI, prescriptions and request for records have been voided.

## 2017-06-22 NOTE — Telephone Encounter (Signed)
Please help me get her urgent care record June 19 2017, including laboratory evaluations and all the imaging study  [address] [id number]     [phone]

## 2017-06-22 NOTE — Progress Notes (Signed)
PATIENT: Megan Hall DOB: Jan 31, 1986  Chief Complaint  Patient presents with  . Fasciculations    She has continued to have bilateral twitching of her eyelids.  States her symptoms are unchanged since her last visit in 11/2013.  On 05/31/17, she started having intermittent episodes of numbness, tingling and stabbing pain to both sides of her face.       HISTORICAL  Megan Hall is a 31 years old left-handed African American female, referred by her primary care physician Dr. Wynelle Link for evaluation of facial muscle spasm, last clinical visit was in February 2015  Over past 1 year, without clear trigger event, she noticed right more than left eye twitching, its induced when she trying to close her eyes, she has no involuntary muscle twitching otherwise, over the years, it is getting worse, she work at [employer], is causing mild difficulty for her, she also felt heavy of her right face, she denies dysarthria, no dysphagia, no visual loss, no hearing loss, sometimes she felt right ear fullness sensation,  In addition she felt intermittent muscle twitching involving her limb muscles, no weakness, she is concerned about the possibility of multiple sclerosis, ALS with her symptoms,     Laboratory evaluation in December third 2014 showed normal BMP, TSH, vitamin D,   ultrasound of thyroid in December  2014 showed right lower lobe complex mixed cystic and solid lesions, she underwent thyroid complex nodule needle aspiration in November 20 2012 was told that was a benign lesion   Update June 22 2017: She lost follow-up since previous visit in 2015, she is accompanied by her friend at today's clinical visit, following her urgent care visit on June 15 2017.  I reviewed her record from [facility] care, she complains of intermittent numbness tingling of right cheek starting since early August 2018. Per record, laboratory evaluation CBC, CMP, vitamin B12, vitamin D, thyroid panel  was performed. But I do not have the results.  Today she continue complains of intermittent muscle twitching involving both eye, right worse than left, most noticeable when she trying to close her eyes, but disappeared when she opened her eyes, she also complains of rising numbness from chin to both ear, but no significant TMJ tenderness upon deep palpitation,  She also complains of behind the eye pain, shooting pain at her skull, I initially planning on MRI of the brain, laboratory evaluations, and some medication treatment for her symptoms.  But during today's interview, she become very aggressive, despite my multiple effort tried to explain the workup and treatment plan for her.  My office and I will not continue provide care for her.   REVIEW OF SYSTEMS: Full 14 system review of systems performed and notable only for headache, numbness, weakness, restless leg, anxiety  ALLERGIES: Allergies  Allergen Reactions  . Peanuts [Peanut Oil] Swelling    HOME MEDICATIONS: No current outpatient prescriptions on file.   No current facility-administered medications for this visit.     PAST MEDICAL HISTORY: Past Medical History:  Diagnosis Date  . Fasciculations   . HA (headache)   . Hypothyroidism   . Vitamin D deficiency     PAST SURGICAL HISTORY: Past Surgical History:  Procedure Laterality Date  . None      FAMILY HISTORY: Family History  Problem Relation Age of Onset  . High blood pressure Mother     SOCIAL HISTORY:  Social History   Social History  . Marital status: Single  Spouse name: N/A  . Number of children: 0  . Years of education: 2 years college   Occupational History  .  [employer]   Social History Main Topics  . Smoking status: Never Smoker  . Smokeless tobacco: Never Used  . Alcohol use 0.6 oz/week    1 Glasses of wine per week     Comment: OCC   . Drug use: No  . Sexual activity: Not on file   Other Topics Concern  . Not on file     Social History Narrative   Patient lives at home alone - single. Patient works full time at [employer].   Left handed   Education some college   Caffeine - none     PHYSICAL EXAM   Vitals:   06/22/17 1421  BP: 138/70  Pulse: (!) 109  Weight: 188 lb (85.3 kg)  Height: 5\' 8"  (1.727 m)    Not recorded      Body mass index is 28.59 kg/m.  PHYSICAL EXAMNIATION:  Gen: NAD, conversant, well nourised, obese, well groomed                     Cardiovascular: Regular rate rhythm, no peripheral edema, warm, nontender. Eyes: Conjunctivae clear without exudates or hemorrhage Neck: Supple, no carotid bruits. Pulmonary: Clear to auscultation bilaterally   NEUROLOGICAL EXAM:  MENTAL STATUS: Speech:    Speech is normal; fluent and spontaneous with normal comprehension.  Cognition:     Orientation to time, place and person     Normal recent and remote memory     Normal Attention span and concentration     Normal Language, naming, repeating,spontaneous speech     Fund of knowledge   CRANIAL NERVES: CN II: Visual fields are full to confrontation. Fundoscopic exam is normal with sharp discs and no vascular changes. Pupils are round equal and briskly reactive to light. CN III, IV, VI: extraocular movement are normal. No ptosis. CN V: Facial sensation is intact to pinprick in all 3 divisions bilaterally. Corneal responses are intact.  CN VII: Face is symmetric with normal eye closure and smile. CN VIII: Hearing is normal to rubbing fingers CN IX, X: Palate elevates symmetrically. Phonation is normal. CN XI: Head turning and shoulder shrug are intact CN XII: Tongue is midline with normal movements and no atrophy.  MOTOR: There is no pronator drift of out-stretched arms. Muscle bulk and tone are normal. Muscle strength is normal.  REFLEXES: Reflexes are 2+ and symmetric at the biceps, triceps, knees, and ankles. Plantar responses are flexor.  SENSORY: Intact to light touch,  pinprick, positional sensation and vibratory sensation are intact in fingers and toes.  COORDINATION: Rapid alternating movements and fine finger movements are intact. There is no dysmetria on finger-to-nose and heel-knee-shin.    GAIT/STANCE: Posture is normal. Gait is steady with normal steps, base, arm swing, and turning. Heel and toe walking are normal. Tandem gait is normal.  Romberg is absent.   DIAGNOSTIC DATA (LABS, IMAGING, TESTING) - I reviewed patient records, labs, notes, testing and imaging myself where available.   ASSESSMENT AND PLAN  Megan Hall is a 31 y.o. female   With constellation of complaints, including intermittent muscle twitching at bilateral orbicularis oculi only present when she trying to close her eyes, also multiple complaints about facial paresthesia, headaches, behind the eye pain.  She had normal neurological examinations,  I tried to provide further diagnostic and treatment plan, has  offered her MRI of the brain, more extensive laboratory evaluations, and medication treatment. But during the interview, patient become very aggressive, arguing the plan provided, despite my multiple effort of explaining the findings and plans.   WE WILL DISCHARGE HER FROM OUR CLINIC.  Levert FeinsteinYijun Kreed Kauffman, M.D. Ph.D.  [facility] [address], [address] [place], [place] [address] Ph: [phone] Fax: [phone]  CC: Referring Provider

## 2017-06-23 ENCOUNTER — Telehealth: Payer: Self-pay | Admitting: Neurology

## 2017-06-23 NOTE — Telephone Encounter (Signed)
PLEASE DISCHARGE HER FROM OUR CLINIC

## 2017-06-27 ENCOUNTER — Encounter: Payer: Self-pay | Admitting: Neurology

## 2017-07-07 ENCOUNTER — Ambulatory Visit: Payer: 59 | Admitting: Family Medicine

## 2017-07-11 ENCOUNTER — Encounter: Payer: Self-pay | Admitting: Family Medicine

## 2017-07-11 ENCOUNTER — Ambulatory Visit (INDEPENDENT_AMBULATORY_CARE_PROVIDER_SITE_OTHER): Payer: 59 | Admitting: Family Medicine

## 2017-07-11 VITALS — BP 110/80 | HR 113 | Temp 98.2°F | Ht 67.5 in | Wt 186.0 lb

## 2017-07-11 DIAGNOSIS — R200 Anesthesia of skin: Secondary | ICD-10-CM | POA: Diagnosis not present

## 2017-07-11 DIAGNOSIS — F4320 Adjustment disorder, unspecified: Secondary | ICD-10-CM

## 2017-07-11 DIAGNOSIS — G8929 Other chronic pain: Secondary | ICD-10-CM

## 2017-07-11 DIAGNOSIS — R201 Hypoesthesia of skin: Secondary | ICD-10-CM

## 2017-07-11 DIAGNOSIS — Z7689 Persons encountering health services in other specified circumstances: Secondary | ICD-10-CM

## 2017-07-11 DIAGNOSIS — R51 Headache: Secondary | ICD-10-CM

## 2017-07-11 DIAGNOSIS — Z91018 Allergy to other foods: Secondary | ICD-10-CM | POA: Diagnosis not present

## 2017-07-11 DIAGNOSIS — F4321 Adjustment disorder with depressed mood: Secondary | ICD-10-CM

## 2017-07-11 DIAGNOSIS — R253 Fasciculation: Secondary | ICD-10-CM | POA: Diagnosis not present

## 2017-07-11 DIAGNOSIS — R519 Headache, unspecified: Secondary | ICD-10-CM

## 2017-07-11 DIAGNOSIS — R202 Paresthesia of skin: Secondary | ICD-10-CM

## 2017-07-11 MED ORDER — NORTRIPTYLINE HCL 10 MG PO CAPS: 10.0000 mg | ORAL_CAPSULE | Freq: Every day | ORAL | 1 refills | Status: DC

## 2017-07-11 MED ORDER — EPINEPHRINE 0.3 MG/0.3ML IJ SOAJ: 0.3000 mg | Freq: Once | INTRAMUSCULAR | 1 refills | Status: AC

## 2017-07-11 NOTE — Patient Instructions (Addendum)
We have ordered labs at this visit. It can take up to 1-2 weeks for results and processing. If results require follow up or explanation, we will call you with instructions. Clinically stable results will be released to your [facility] or sent to you via mail. If you have not heard from [place] or cannot find your results in [facility] in 2 weeks please contact our office at [phone].    Complicated Grieving Grief is a normal response to the death of someone close to you. Feelings of fear, anger, and guilt can affect almost everyone who loses a loved one. It is also common to have symptoms of depression while you are grieving. These include problems with sleep, loss of appetite, and lack of energy. They may last for weeks or months after a loss. Complicated grief is different from normal grief or depression. Normal grieving involves sadness and feelings of loss, but these feelings are not constant. Complicated grief is a constant and severe type of grief. It interferes with your ability to function normally. It may last for several months to a year or longer. Complicated grief may require treatment from a mental health care provider. What are the causes? It is not known why some people continue to struggle with grief and others do not. You may be at higher risk for complicated grief if:  The death of your loved one was sudden or unexpected.  The death of your loved one was due to a violent event.  Your loved one committed suicide.  Your loved one was a child or a young person.  You were very close to or dependent on the loved one.  You have a history of depression.  What are the signs or symptoms? Signs and symptoms of complicated grief may include:  Feeling disbelief or numbness.  Being unable to enjoy good memories of your loved one.  Needing to avoid anything that reminds you of your loved one.  Being unable to stop thinking about the death.  Feeling intense anger or guilt.  Feeling  alone and hopeless.  Feeling that your life is meaningless and empty.  Losing the desire to live.  How is this diagnosed? Your health care provider may diagnose complicated grief if:  You have constant symptoms of grief for 6-12 months or longer.  Your symptoms are interfering with your ability to live your life.  Your health care provider may want you to see a mental health care provider. Many symptoms of depression are similar to the symptoms of complicated grief. It is important to be evaluated for complicated grief along with other mental health conditions. How is this treated? Talk therapy with a mental health provider is the most common treatment for complicated grief. During therapy, you will learn healthy ways to cope with the loss of your loved one. In some cases, your mental health care provider may also recommend antidepressant medicines. Follow these instructions at home:  Take care of yourself. ? Eat regular meals and maintain a healthy diet. Eat plenty of fruits, vegetables, and whole grains. ? Try to get some exercise each day. ? Keep regular hours for sleep. Try to get at least 8 hours of sleep each night.  Do not use drugs or alcohol to ease your symptoms.  Take medicines only as directed by your health care provider.  Spend time with friends and loved ones.  Consider joining a grief (bereavement) support group to help you deal with your loss.  Keep all follow-up visits as  directed by your health care provider. This is important. Contact a health care provider if:  Your symptoms keep you from functioning normally.  Your symptoms do not get better with treatment. Get help right away if:  You have serious thoughts of hurting yourself or someone else.  You have suicidal feelings. This information is not intended to replace advice given to you by your health care provider. Make sure you discuss any questions you have with your health care provider. Document  Released: 10/10/2005 Document Revised: 03/17/2016 Document Reviewed: 03/20/2014 Elsevier Interactive Patient Education  2018 [employer] and Stress Management Stress is a normal reaction to life events. It is what you feel when life demands more than you are used to or more than you can handle. Some stress can be useful. For example, the stress reaction can help you catch the last bus of the day, study for a test, or meet a deadline at work. But stress that occurs too often or for too long can cause problems. It can affect your emotional health and interfere with relationships and normal daily activities. Too much stress can weaken your immune system and increase your risk for physical illness. If you already have a medical problem, stress can make it worse. What are the causes? All sorts of life events may cause stress. An event that causes stress for one person may not be stressful for another person. Major life events commonly cause stress. These may be positive or negative. Examples include losing your job, moving into a new home, getting married, having a baby, or losing a loved one. Less obvious life events may also cause stress, especially if they occur day after day or in combination. Examples include working long hours, driving in traffic, caring for children, being in debt, or being in a difficult relationship. What are the signs or symptoms? Stress may cause emotional symptoms including, the following:  Anxiety. This is feeling worried, afraid, on edge, overwhelmed, or out of control.  Anger. This is feeling irritated or impatient.  Depression. This is feeling sad, down, helpless, or guilty.  Difficulty focusing, remembering, or making decisions.  Stress may cause physical symptoms, including the following:  Aches and pains. These may affect your head, neck, back, stomach, or other areas of your body.  Tight muscles or clenched jaw.  Low energy or trouble  sleeping.  Stress may cause unhealthy behaviors, including the following:  Eating to feel better (overeating) or skipping meals.  Sleeping too little, too much, or both.  Working too much or putting off tasks (procrastination).  Smoking, drinking alcohol, or using drugs to feel better.  How is this diagnosed? Stress is diagnosed through an assessment by your health care provider. Your health care provider will ask questions about your symptoms and any stressful life events.Your health care provider will also ask about your medical history and may order blood tests or other tests. Certain medical conditions and medicine can cause physical symptoms similar to stress. Mental illness can cause emotional symptoms and unhealthy behaviors similar to stress. Your health care provider may refer you to a mental health professional for further evaluation. How is this treated? Stress management is the recommended treatment for stress.The goals of stress management are reducing stressful life events and coping with stress in healthy ways. Techniques for reducing stressful life events include the following:  Stress identification. Self-monitor for stress and identify what causes stress for you. These skills may help you to avoid some stressful events.  Time management. Set your priorities, keep a calendar of events, and learn to say "no." These tools can help you avoid making too many commitments.  Techniques for coping with stress include the following:  Rethinking the problem. Try to think realistically about stressful events rather than ignoring them or overreacting. Try to find the positives in a stressful situation rather than focusing on the negatives.  Exercise. Physical exercise can release both physical and emotional tension. The key is to find a form of exercise you enjoy and do it regularly.  Relaxation techniques. These relax the body and mind. Examples include yoga, meditation, tai chi,  biofeedback, deep breathing, progressive muscle relaxation, listening to music, being out in nature, journaling, and other hobbies. Again, the key is to find one or more that you enjoy and can do regularly.  Healthy lifestyle. Eat a balanced diet, get plenty of sleep, and do not smoke. Avoid using alcohol or drugs to relax.  Strong support network. Spend time with family, friends, or other people you enjoy being around.Express your feelings and talk things over with someone you trust.  Counseling or talktherapy with a mental health professional may be helpful if you are having difficulty managing stress on your own. Medicine is typically not recommended for the treatment of stress.Talk to your health care provider if you think you need medicine for symptoms of stress. Follow these instructions at home:  Keep all follow-up visits as directed by your health care provider.  Take all medicines as directed by your health care provider. Contact a health care provider if:  Your symptoms get worse or you start having new symptoms.  You feel overwhelmed by your problems and can no longer manage them on your own. Get help right away if:  You feel like hurting yourself or someone else. This information is not intended to replace advice given to you by your health care provider. Make sure you discuss any questions you have with your health care provider. Document Released: 04/05/2001 Document Revised: 03/17/2016 Document Reviewed: 06/04/2013 Elsevier Interactive Patient Education  2017 Elsevier Inc. Nortriptyline capsules What is this medicine? NORTRIPTYLINE (nor TRIP ti leen) is used to treat depression. This medicine may be used for other purposes; ask your health care provider or pharmacist if you have questions. COMMON BRAND NAME(S): Aventyl, Pamelor What should I tell my health care provider before I take this medicine? They need to know if you have any of these conditions: -an alcohol  problem -bipolar disorder or schizophrenia -difficulty passing urine, prostate trouble -glaucoma -heart disease or recent heart attack -liver disease -over active thyroid -seizures -thoughts or plans of suicide or a previous suicide attempt or family history of suicide attempt -an unusual or allergic reaction to nortriptyline, other medicines, foods, dyes, or preservatives -pregnant or trying to get pregnant -breast-feeding How should I use this medicine? Take this medicine by mouth with a glass of water. Follow the directions on the prescription label. Take your doses at regular intervals. Do not take it more often than directed. Do not stop taking this medicine suddenly except upon the advice of your doctor. Stopping this medicine too quickly may cause serious side effects or your condition may worsen. A special MedGuide will be given to you by the pharmacist with each prescription and refill. Be sure to read this information carefully each time. Talk to your pediatrician regarding the use of this medicine in children. Special care may be needed. Overdosage: If you think you have taken too much  of this medicine contact a poison control center or emergency room at once. NOTE: This medicine is only for you. Do not share this medicine with others. What if I miss a dose? If you miss a dose, take it as soon as you can. If it is almost time for your next dose, take only that dose. Do not take double or extra doses. What may interact with this medicine? Do not take this medicine with any of the following medications: -arsenic trioxide -certain medicines medicines for irregular heart beat -cisapride -halofantrine -linezolid -MAOIs like Carbex, Eldepryl, Marplan, Nardil, and Parnate -methylene blue (injected into a vein) -other medicines for mental depression -phenothiazines like perphenazine, thioridazine and chlorpromazine -pimozide -probucol -procarbazine -sparfloxacin -St. John's  Wort -ziprasidone This medicine may also interact with any of the following medications: -atropine and related drugs like hyoscyamine, scopolamine, tolterodine and others -barbiturate medicines for inducing sleep or treating seizures, such as phenobarbital -cimetidine -medicines for diabetes -medicines for seizures like carbamazepine or phenytoin -reserpine -thyroid medicine This list may not describe all possible interactions. Give your health care provider a list of all the medicines, herbs, non-prescription drugs, or dietary supplements you use. Also tell them if you smoke, drink alcohol, or use illegal drugs. Some items may interact with your medicine. What should I watch for while using this medicine? Tell your doctor if your symptoms do not get better or if they get worse. Visit your doctor or health care professional for regular checks on your progress. Because it may take several weeks to see the full effects of this medicine, it is important to continue your treatment as prescribed by your doctor. Patients and their families should watch out for new or worsening thoughts of suicide or depression. Also watch out for sudden changes in feelings such as feeling anxious, agitated, panicky, irritable, hostile, aggressive, impulsive, severely restless, overly excited and hyperactive, or not being able to sleep. If this happens, especially at the beginning of treatment or after a change in dose, call your health care professional. Dennis Bast may get drowsy or dizzy. Do not drive, use machinery, or do anything that needs mental alertness until you know how this medicine affects you. Do not stand or sit up quickly, especially if you are an older patient. This reduces the risk of dizzy or fainting spells. Alcohol may interfere with the effect of this medicine. Avoid alcoholic drinks. Do not treat yourself for coughs, colds, or allergies without asking your doctor or health care professional for advice. Some  ingredients can increase possible side effects. Your mouth may get dry. Chewing sugarless gum or sucking hard candy, and drinking plenty of water may help. Contact your doctor if the problem does not go away or is severe. This medicine may cause dry eyes and blurred vision. If you wear contact lenses you may feel some discomfort. Lubricating drops may help. See your eye doctor if the problem does not go away or is severe. This medicine can cause constipation. Try to have a bowel movement at least every 2 to 3 days. If you do not have a bowel movement for 3 days, call your doctor or health care professional. This medicine can make you more sensitive to the sun. Keep out of the sun. If you cannot avoid being in the sun, wear protective clothing and use sunscreen. Do not use sun lamps or tanning beds/booths. What side effects may I notice from receiving this medicine? Side effects that you should report to your doctor or health  care professional as soon as possible: -allergic reactions like skin rash, itching or hives, swelling of the face, lips, or tongue -anxious -breathing problems -changes in vision -confusion -elevated mood, decreased need for sleep, racing thoughts, impulsive behavior -eye pain -fast, irregular heartbeat -feeling faint or lightheaded, falls -feeling agitated, angry, or irritable -fever with increased sweating -hallucination, loss of contact with reality -seizures -stiff muscles -suicidal thoughts or other mood changes -tingling, pain, or numbness in the feet or hands -trouble passing urine or change in the amount of urine -trouble sleeping -unusually weak or tired -vomiting -yellowing of the eyes or skin Side effects that usually do not require medical attention (report to your doctor or health care professional if they continue or are bothersome): -change in sex drive or performance -change in appetite or weight -constipation -dizziness -dry  mouth -nausea -tired -tremors -upset stomach This list may not describe all possible side effects. Call your doctor for medical advice about side effects. You may report side effects to FDA at 1-800-FDA-1088. Where should I keep my medicine? Keep out of the reach of children. Store at room temperature between 15 and 30 degrees C (59 and 86 degrees F). Keep container tightly closed. Throw away any unused medicine after the expiration date. NOTE: This sheet is a summary. It may not cover all possible information. If you have questions about this medicine, talk to your doctor, pharmacist, or health care provider.  2018 Elsevier/Gold Standard (2016-03-11 12:53:08)

## 2017-07-11 NOTE — Progress Notes (Signed)
Patient presents to clinic today to establish care for acute issue.  SUBJECTIVE: PMH: Pt is a 31 yo AAF with pmh sig for migraine. Patient formally seen by Evert Kohl.  Patient endorses recent episode of feeling an electrical shock in b/l eyes on Aug 6th. The episode lasted a few seconds. Since then patient has had numbness and tingling in the right side of face. Patient also experienced fasciculations in both sides of face. "It feels like her right eye twitching". Patient is also having jaw pain and frequent headaches.  Patient made an appointment with [facility] neurology, seen by Dr. Terrace Arabia, but there was a misunderstanding. At the time of visit Dr. Elana Alm was going to prescribe nortriptyline and Imitrex but did not complete the prescriptions. Patient also saw the dentist, Dr. Bonney Roussel and x-rays were negative. She then went to the eye doctor and her vision was normal. Patient continuing to have problems decided to establish care with PCP.  Of note, patient had thyroid and electrolytes checked, which were normal.  Seen by Dr. Henderson Cloud at [facility] physicians.  Pt takes numerous supplements: Miracle 2000 MG, fish oil 1600 mg, black current 500 mg, natural calm Q, vitamin D.  Allergies: NKDA Peanuts-tingling Almonds and pecans- itching, eye and tongue swelling.  Past surgical history: None  Social history: Patient is currently Engineer, site at [employer]. Patient is dealing with the loss of her fianc, who died in 26-Jan-2023.   He was found in a river in [place]. Patient states she cries every day. Patient endorses strong support in family and friends. Patient denies cigarette, alcohol, drug use.   Family medical history: Mom-HTN Maternal grandmother-hypertension Paternal grandmother-breast cancer 2 1 sister, 2 brothers-AAW  Health Maintenance: Dental -- last month Vision --last month PAP -- 2 years ago   Past Medical History:  Diagnosis Date  . Fasciculations   . HA (headache)     . Hypothyroidism   . Vitamin D deficiency     Past Surgical History:  Procedure Laterality Date  . None      No current outpatient prescriptions on file prior to visit.   No current facility-administered medications on file prior to visit.     Allergies  Allergen Reactions  . Peanuts [Peanut Oil] Swelling    Family History  Problem Relation Age of Onset  . High blood pressure Mother     Social History   Social History  . Marital status: Single    Spouse name: N/A  . Number of children: 0  . Years of education: 2 years college   Occupational History  .  [employer]   Social History Main Topics  . Smoking status: Never Smoker  . Smokeless tobacco: Never Used  . Alcohol use 0.6 oz/week    1 Glasses of wine per week     Comment: OCC   . Drug use: No  . Sexual activity: Not on file   Other Topics Concern  . Not on file   Social History Narrative   Patient lives at home alone - single. Patient works full time at [employer].   Left handed   Education some college   Caffeine - none    ROS General: Denies fever, chills, night sweats, changes in weight, changes in appetite HEENT: Denies headaches, ear pain, changes in vision, rhinorrhea, sore throat  +HA, Face numbness/tingling, fasciculations in face, jaw pain CV: Denies CP, palpitations, SOB, orthopnea Pulm: Denies SOB, cough, wheezing GI: Denies abdominal  pain, nausea, vomiting, diarrhea, constipation GU: Denies dysuria, hematuria, frequency, vaginal discharge Msk: Denies muscle cramps, joint pains Neuro: + Numbness and tingling in face Skin: Denies rashes, bruising Psych: Denies anxiety, hallucinations + depressed mood  BP 110/80 (BP Location: Left Arm, Patient Position: Sitting, Cuff Size: Normal)   Pulse (!) 113   Temp 98.2 F (36.8 C) (Oral)   Ht 5' 7.5" (1.715 m)   Wt 186 lb (84.4 kg)   LMP 07/08/2017 (Exact Date)   BMI 28.70 kg/m   Physical Exam  Gen. Pleasant, well developed,  well-nourished, in NAD HEENT - /AT, face symmetric, PERRL, EOMI,no scleral icterus, no nasal drainage, pharynx without erythema or exudate.  Fasciculations of right eyelid when closed. Neck: No JVD, no thyromegaly Lungs: no use of accessory muscles, no dullness to percussion, CTAB, no wheezes, rales or rhonchi Cardiovascular: RRR, No r/g/m, no peripheral edema Abdomen: BS present, soft, nontender,nondistended, no hepatosplenomegaly Musculoskeletal: No deformities, moves all four extremities, normal tone.  Strength in upper extremities equal bilaterally 5/5 Neuro:  A&Ox3, CN II-XII intact, normal gait.  Sensation of face intact and equal b/l. Skin:  Warm, dry, intact, no lesions Psych: normal affect, depressed mood-tearful at times   No results found for this or any previous visit (from the past 2160 hour(s)).  Assessment/Plan: Facial numbness/fasiculations -Will obtain records release her recent lab. If unable to attain will repeat labs. -Normal sensation exam. Possibly secondary to increased stress.  - Plan: MR Brain W Wo Contrast to rule out any intracranial process. -May need new referral to a different neurologist.  Facial tingling  - Plan: MR Brain W Wo Contrast -Follow-up in 2-4 weeks.  Chronic nonintractable headache, unspecified headache type  - Plan: MR Brain W Wo Contrast, Basic metabolic panel -Will start nortriptyline.  Grief -Recent loss of fianc. -Stable -PHQ9 score 18.  Moderate severe. -Given handout for area therapist. -Given handout. -Start nortriptyline 10 mg -Follow-up in 2-4 weeks  Encounter to establish care -Records release  Tree nut allergy -Continue to avoid trigger. -Discussed proper use of EpiPen.  - Plan: EPINEPHrine 0.3 mg/0.3 mL IJ SOAJ injection

## 2017-07-12 LAB — BASIC METABOLIC PANEL
BUN: 9 mg/dL (ref 6–23)
CALCIUM: 9.9 mg/dL (ref 8.4–10.5)
CO2: 29 mEq/L (ref 19–32)
Chloride: 105 mEq/L (ref 96–112)
Creatinine, Ser: 0.75 mg/dL (ref 0.40–1.20)
GFR: 116.1 mL/min (ref >60.00)
GLUCOSE: 97 mg/dL (ref 70–99)
Potassium: 5 mEq/L (ref 3.5–5.1)
SODIUM: 140 meq/L (ref 135–145)

## 2017-07-18 ENCOUNTER — Ambulatory Visit
Admission: RE | Admit: 2017-07-18 | Discharge: 2017-07-18 | Disposition: A | Discharge status: Home / Self Care | Payer: 59 | Source: Ambulatory Visit | Attending: Family Medicine | Admitting: Family Medicine

## 2017-07-18 DIAGNOSIS — R519 Headache, unspecified: Secondary | ICD-10-CM

## 2017-07-18 DIAGNOSIS — R202 Paresthesia of skin: Secondary | ICD-10-CM

## 2017-07-18 DIAGNOSIS — R51 Headache: Secondary | ICD-10-CM

## 2017-07-18 DIAGNOSIS — R200 Anesthesia of skin: Secondary | ICD-10-CM

## 2017-07-18 DIAGNOSIS — G8929 Other chronic pain: Secondary | ICD-10-CM

## 2017-07-18 MED ORDER — GADOBENATE DIMEGLUMINE 529 MG/ML IV SOLN
17.0000 mL | Freq: Once | INTRAVENOUS | Status: AC | PRN
  Administered 2017-07-18: 17 mL via INTRAVENOUS

## 2017-08-01 ENCOUNTER — Telehealth: Payer: Self-pay | Admitting: *Deleted

## 2017-08-01 NOTE — Telephone Encounter (Signed)
Return certified mail on 08/01/17 

## 2017-09-18 ENCOUNTER — Telehealth: Payer: Self-pay | Admitting: Family Medicine

## 2017-09-18 NOTE — Telephone Encounter (Signed)
Refill Request.  

## 2017-09-18 NOTE — Telephone Encounter (Signed)
Copied from CRM [id number]. Topic: Quick Communication - Rx Refill/Question >> Sep 18, 2017 10:56 AM Crist InfanteHarrald, Kathy J wrote: Has the patient contacted their pharmacy? no (Agent: If no, request that the patient contact the pharmacy for the refill.) Preferred Pharmacy (with phone number or street name): [facility] [place] - [address] [phone] (Phone) [phone] (Fax)  Pt states Dr Salomon FickBanks put her on this temp and it is working very good.  Doesn't want to be without and requesting refill today if possible.  Agent: Please be advised that RX refills may take up to 48 hours. We ask that you follow-up with your pharmacy.  >> Sep 18, 2017  4:40 PM Alexander BergeronBarksdale, Harvey B wrote: Pt called and stated the Rx of nortriptyline is what needs to be refilled

## 2017-09-18 NOTE — Telephone Encounter (Signed)
Copied from CRM [id number]. Topic: Quick Communication - Rx Refill/Question >> Sep 18, 2017 10:56 AM Crist InfanteHarrald, Kathy J wrote: Has the patient contacted their pharmacy? no (Agent: If no, request that the patient contact the pharmacy for the refill.)nortriptyline (PAMELOR) 10 MG capsule Preferred Pharmacy (with phone number or street name): [facility] [place] - [address] [phone] (Phone) [phone] (Fax)  Pt states Dr Salomon FickBanks put her on this temp and it is working very good.  Doesn't want to be without and requesting refill today if possible.  Agent: Please be advised that RX refills may take up to 48 hours. We ask that you follow-up with your pharmacy.

## 2017-09-18 NOTE — Telephone Encounter (Signed)
Left a VM for patient confirming which medication she needed a refill for.

## 2017-09-19 ENCOUNTER — Other Ambulatory Visit: Payer: Self-pay | Admitting: Family Medicine

## 2017-09-19 ENCOUNTER — Other Ambulatory Visit: Payer: Self-pay | Admitting: Emergency Medicine

## 2017-09-19 MED ORDER — NORTRIPTYLINE HCL 10 MG PO CAPS: 10.0000 mg | ORAL_CAPSULE | Freq: Every day | ORAL | 0 refills | Status: DC

## 2017-09-19 NOTE — Telephone Encounter (Signed)
Patient would like a refill for Nortriptyline. Saw in your last note that she needed to f/u in two weeks. Would you like for patient to schedule an appointment for any further refills? Please advise

## 2017-09-19 NOTE — Telephone Encounter (Signed)
Spoke with patient regarding prescription for Nortriptyline being sent to her pharmacy. Patient understood and had no further questions

## 2017-09-19 NOTE — Telephone Encounter (Signed)
Patient would like a refill for Nortriptyline. Message has been sent to PCP to make sure refills is appropriate.

## 2017-09-20 NOTE — Telephone Encounter (Signed)
Left a VM for patient regarding needing to schedule a f/u visit with PCP to receive further refills for Nortriptyline.

## 2017-09-20 NOTE — Telephone Encounter (Signed)
Yes pt should be seen for f/u for refill on Nortriptyline

## 2017-10-26 ENCOUNTER — Ambulatory Visit: Payer: 59 | Admitting: Family Medicine

## 2017-10-26 ENCOUNTER — Encounter: Payer: Self-pay | Admitting: Family Medicine

## 2017-10-26 ENCOUNTER — Ambulatory Visit (INDEPENDENT_AMBULATORY_CARE_PROVIDER_SITE_OTHER): Payer: 59 | Admitting: Family Medicine

## 2017-10-26 VITALS — BP 118/80 | HR 106 | Temp 98.0°F | Wt 191.7 lb

## 2017-10-26 DIAGNOSIS — G514 Facial myokymia: Secondary | ICD-10-CM

## 2017-10-26 DIAGNOSIS — F321 Major depressive disorder, single episode, moderate: Secondary | ICD-10-CM

## 2017-10-26 DIAGNOSIS — G44229 Chronic tension-type headache, not intractable: Secondary | ICD-10-CM | POA: Diagnosis not present

## 2017-10-26 MED ORDER — NORTRIPTYLINE HCL 10 MG PO CAPS: 10.0000 mg | ORAL_CAPSULE | Freq: Every day | ORAL | 3 refills | Status: AC

## 2017-10-26 NOTE — Progress Notes (Signed)
Subjective:    Patient ID: Megan Hall, female    DOB: [birth year], 32 y.o.   MRN: [id number]  No chief complaint on file.   HPI Patient was seen today for f/u.  Pt last seen in September 2018.  At the time pt started on Nortriptyline 10 mg for depression/grief (patient's fianc was found dead in March 2018), chronic HAs, facial twitching.  MRI of head was normal on 07/18/17.  Since then, pt states she noticed an improvement in her facial twitching, HAs, and overall mood.  Pt states the holidays were rough but she had the support of friends and coworkers to help her through it.  Patient would like a refill on nortriptyline.     Past Medical History:  Diagnosis Date  . Fasciculations   . HA (headache)   . Hypothyroidism   . Vitamin D deficiency     Allergies  Allergen Reactions  . Peanuts [Peanut Oil] Swelling    ROS General: Denies fever, chills, night sweats, changes in weight, changes in appetite HEENT: Denies headaches, ear pain, changes in vision, rhinorrhea, sore throat CV: Denies CP, palpitations, SOB, orthopnea Pulm: Denies SOB, cough, wheezing GI: Denies abdominal pain, nausea, vomiting, diarrhea, constipation GU: Denies dysuria, hematuria, frequency, vaginal discharge Msk: Denies muscle cramps, joint pains Neuro: Denies weakness, numbness, tingling  + facial fasciculations Skin: Denies rashes, bruising Psych: Denies anxiety, hallucinations   + depression     Objective:    Blood pressure 118/80, pulse (!) 106, temperature 98 F (36.7 C), temperature source Oral, weight 191 lb 11.2 oz (87 kg).   Gen. Pleasant, well-nourished, in no distress, normal affect   HEENT: [place]/AT, face symmetric, no scleral icterus, PERRLA, EOMI, nares patent without drainage. Lungs: no accessory muscle use, CTAB, no wheezes or rales Cardiovascular: RRR, no m/r/g, no peripheral edema Musculoskeletal: No deformities, no cyanosis or clubbing, normal tone Neuro:  A&Ox3, CN II-XII intact,  normal gait Skin:  Warm, no lesions/ rash   Wt Readings from Last 3 Encounters:  10/26/17 191 lb 11.2 oz (87 kg)  07/11/17 186 lb (84.4 kg)  06/22/17 188 lb (85.3 kg)    Lab Results  Component Value Date   WBC 6.5 05/30/2016   HGB 12.1 05/30/2016   HCT 36.3 05/30/2016   PLT 289 05/30/2016   GLUCOSE 97 07/11/2017   NA 140 07/11/2017   K 5.0 07/11/2017   CL 105 07/11/2017   CREATININE 0.75 07/11/2017   BUN 9 07/11/2017   CO2 29 07/11/2017    Assessment/Plan:  Chronic tension-type headache, not intractable  -Improved -Continue increasing p.o. intake of water, finding ways to decrease stress, increasing physical activity, getting plenty of rest each night - Plan: nortriptyline (PAMELOR) 10 MG capsule  Depression, major, single episode, moderate (HCC) -Improving -PHQ 9 score 12.   Score on 9/18 was 18 -Continue nortriptyline -Follow-up in 1-2 months, sooner if needed - Plan: nortriptyline (PAMELOR) 10 MG capsule  Facial twitching -Possible Bell's palsy.  Improved since starting nortriptyline. - Plan: nortriptyline (PAMELOR) 10 MG capsule   Follow-up in the next 1-2 months.  Sooner if needed.  We will schedule CPE in the future.   Abbe AmsterdamShannon Sabreena Vogan, MD

## 2017-10-26 NOTE — Patient Instructions (Addendum)

## 2018-01-10 ENCOUNTER — Encounter: Payer: Self-pay | Admitting: Family Medicine

## 2018-01-10 DIAGNOSIS — F321 Major depressive disorder, single episode, moderate: Secondary | ICD-10-CM | POA: Insufficient documentation

## 2018-02-06 IMAGING — MR MR HEAD WO/W CM
12 series · 48 of 48 positions shown · IV contrast (17  ML MULTIHANCE)
Comparison: CT head 07/11/2013

CLINICAL DATA: Headache.  Facial paresthesia

EXAM:
MRI HEAD WITHOUT AND WITH CONTRAST
TECHNIQUE: Multiplanar, multiecho pulse sequences of the brain and surrounding
structures were obtained without and with intravenous contrast.
CONTRAST:  17mL MULTIHANCE GADOBENATE DIMEGLUMINE 529 MG/ML IV SOLN

[Series 2: T1 · sagittal · 5.0mm · 0.45mm/px · 1 of 21 slices shown]
[im 1/21]
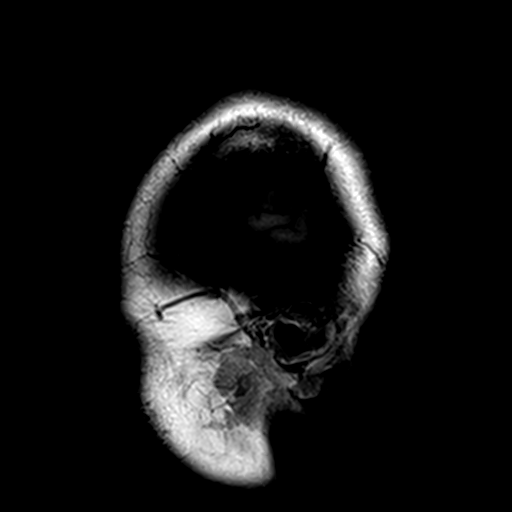

[Series 3: DWI · axial · 3.0mm · 1.80mm/px · z∈[-89,+44]mm · 7 of 100 slices shown (1 of 4)]
[im 1/100]
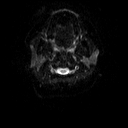
[im 17/100]
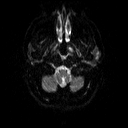
[im 34/100]
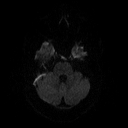
[im 50/100]
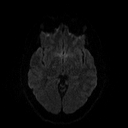
[im 67/100]
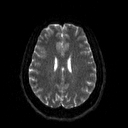
[im 83/100]
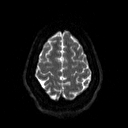
[im 100/100]
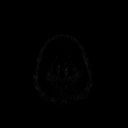

[Series 4: DWI · axial · 3.0mm · 1.80mm/px · z∈[-89,+44]mm · 3 of 50 slices shown (2 of 4)]
[im 1/50]
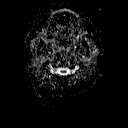
[im 25/50]
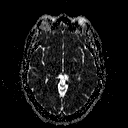
[im 50/50]
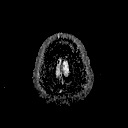

[Series 5: DWI · coronal · 5.0mm · 1.80mm/px · 5 of 67 slices shown (3 of 4)]
[im 1/67]
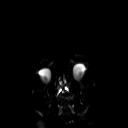
[im 17/67]
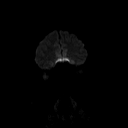
[im 34/67]
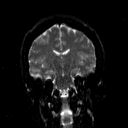
[im 50/67]
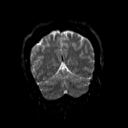
[im 67/67]
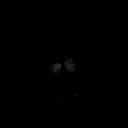

[Series 6: DWI · coronal · 5.0mm · 1.80mm/px · 2 of 34 slices shown (4 of 4)]
[im 1/34]
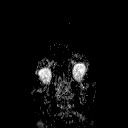
[im 34/34]
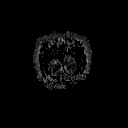

[Series 7: T2 · axial · 5.0mm · 0.51mm/px · z∈[-72,+56]mm · 2 of 22 slices shown (1 of 2)]
[im 1/22]
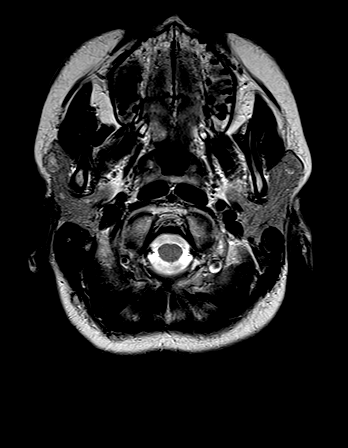
[im 22/22]
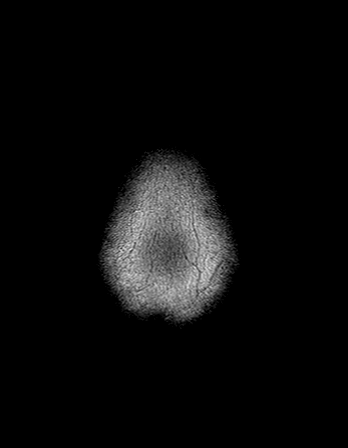

[Series 8: FLAIR · axial · 3.0mm · 0.45mm/px · z∈[-70,+52]mm · 2 of 30 slices shown]
[im 1/30]
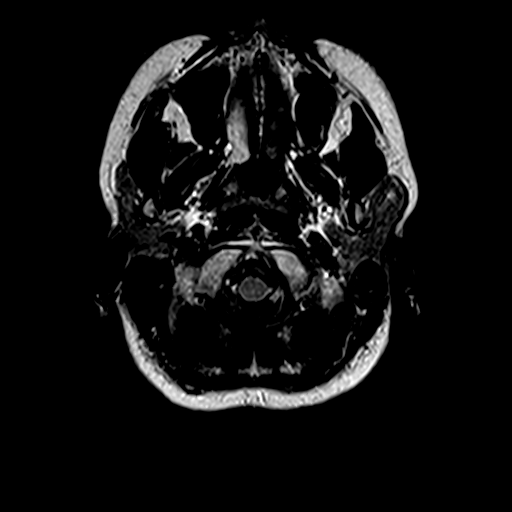
[im 30/30]
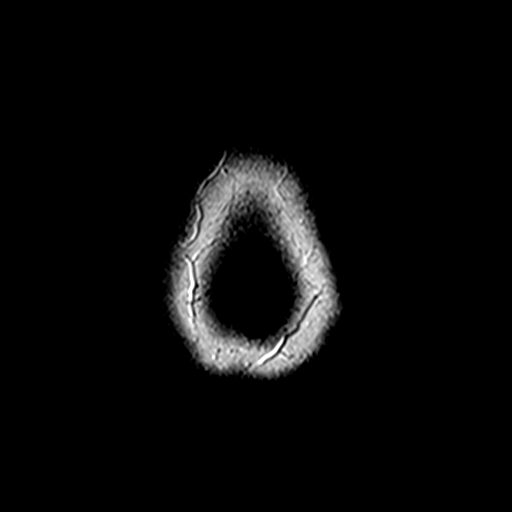

[Series 10: swi_images · axial · 5.0mm · 0.90mm/px · z∈[-74,+57]mm · 2 of 30 slices shown]
[im 1/30]
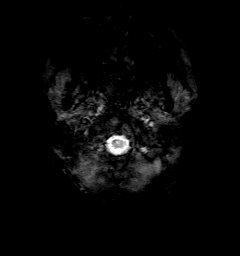
[im 30/30]
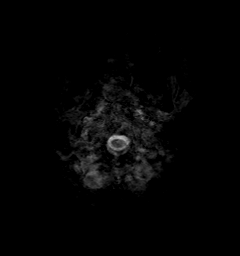

[Series 11: t1_mpr_tra · axial · 1.0mm · 0.75mm/px · z∈[-75,+54]mm · 10 of 144 slices shown (1 of 2)]
[im 1/144]
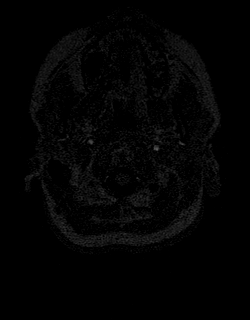
[im 16/144]
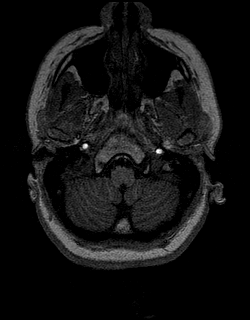
[im 32/144]
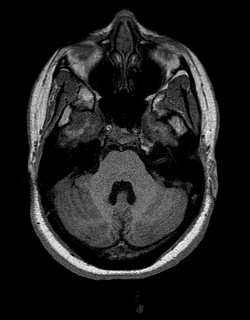
[im 48/144]
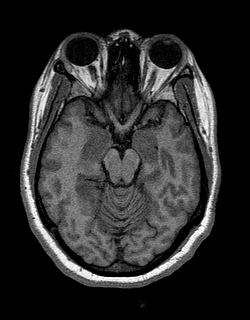
[im 64/144]
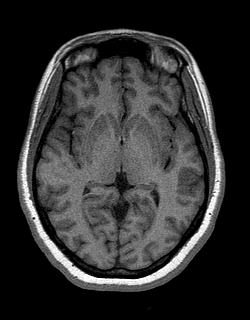
[im 80/144]
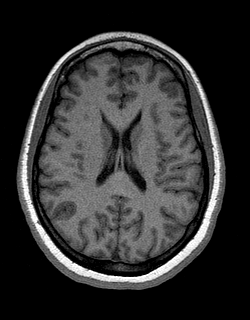
[im 96/144]
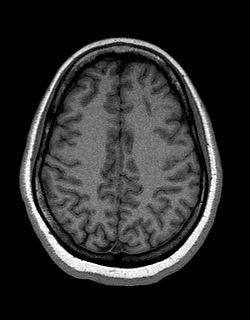
[im 112/144]
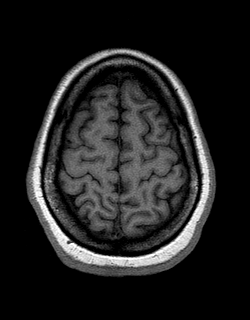
[im 128/144]
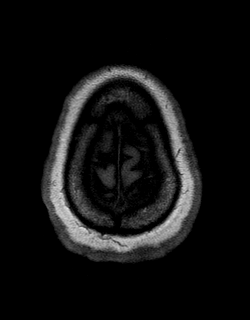
[im 144/144]
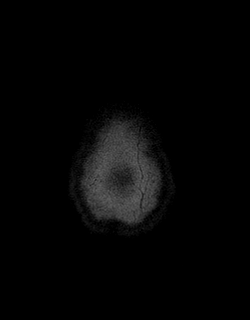

[Series 12: T2 · coronal · 5.0mm · 0.45mm/px · 2 of 25 slices shown (2 of 2)]
[im 1/25]
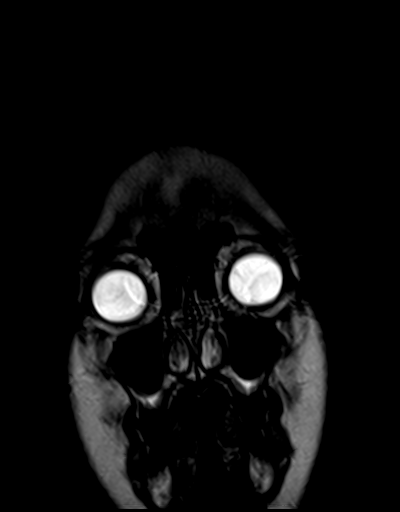
[im 25/25]
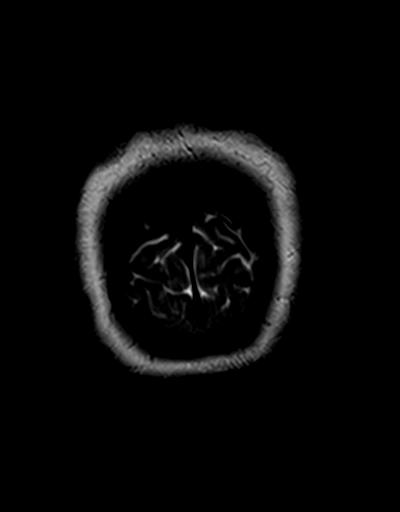

[Series 13: t1_mpr_tra · axial · 1.0mm · 0.75mm/px · z∈[-75,+54]mm · 10 of 144 slices shown (2 of 2)]
[im 1/144]
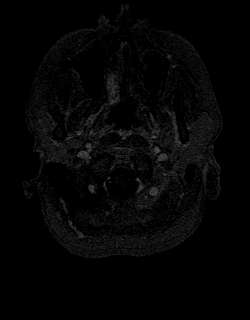
[im 16/144]
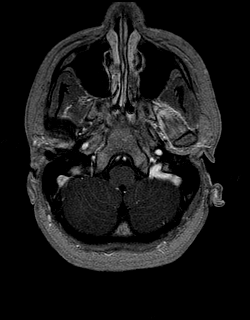
[im 32/144]
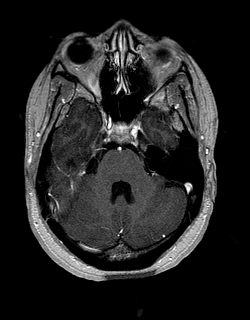
[im 48/144]
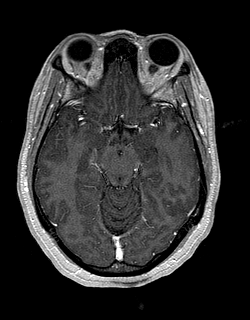
[im 64/144]
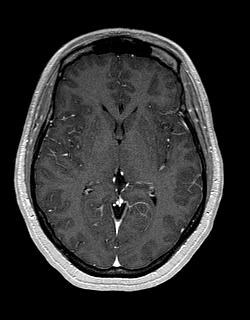
[im 80/144]
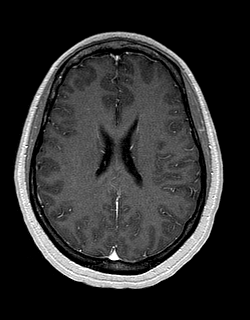
[im 96/144]
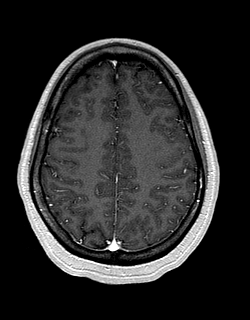
[im 112/144]
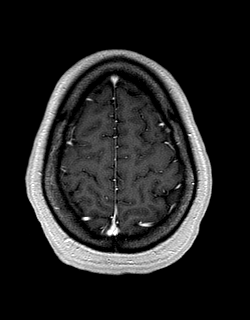
[im 128/144]
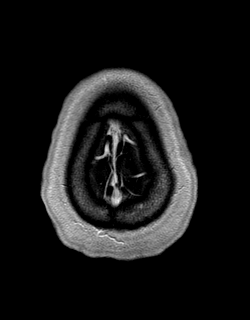
[im 144/144]
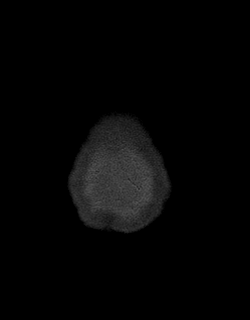

[Series 14: post cor · coronal · 5.0mm · 0.45mm/px · 2 of 25 slices shown]
[im 1/25]
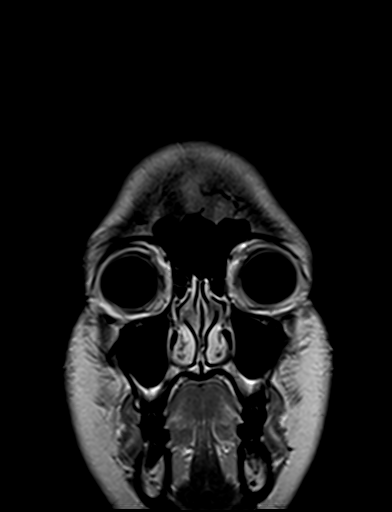
[im 25/25]
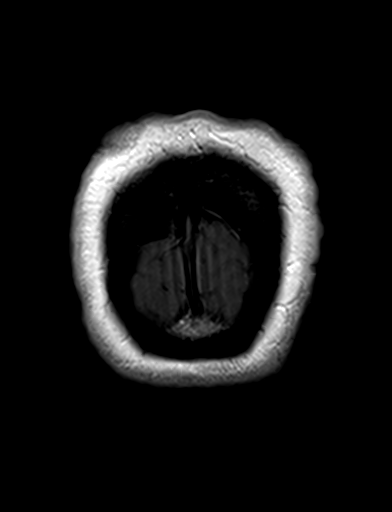

[48 of 48 positions shown; findings below may reference images not displayed]

FINDINGS: Brain: No acute infarction, hemorrhage, hydrocephalus, extra-axial
collection or mass lesion.

Vascular: Normal flow voids.

Skull and upper cervical spine: Negative

Sinuses/Orbits: Negative

Other: None
IMPRESSION: Normal MRI head with contrast

## 2018-03-16 ENCOUNTER — Ambulatory Visit: Payer: Self-pay | Admitting: *Deleted

## 2018-03-16 NOTE — Telephone Encounter (Signed)
Message sent to Dr. Salomon Fick nurse

## 2018-03-16 NOTE — Telephone Encounter (Signed)
I returned her call.  She is c/o having lower abd pain and mid back pain above her butt area that radiates into both sides of her back since May 5th.  The pain seems to be getting worse.  Denies urinary symptoms.   Did mention not having a BM today and usually goes every day.   See triage notes below.  I was making her an appt to be seen today but she informed me she is having her car worked on and doesn't have transportation today.   She requested to wait until Tuesday ([facility] Day weekend this weekend and office closed on Monday) and see Dr. Salomon Fick if possible with the understanding that if her symptoms become worse she will go to the ED or urgent care center. I scheduled her with Dr. Salomon Fick for 03/20/18 at 1:00. She verbalized understanding of the instructions and was agreeable to this plan.  Reason for Disposition . [1] MILD pain (e.g., does not interfere with normal activities) AND [2] pain comes and goes (cramps) AND [3] present > 48 hours  Answer Assessment - Initial Assessment Questions 1. LOCATION: "Where does it hurt?"      Hurts in lower abd both sides and into my back on middle of my lower back.   It feels like a burning. 2. RADIATION: "Does the pain shoot anywhere else?" (e.g., chest, back)     See above 3. ONSET: "When did the pain begin?" (e.g., minutes, hours or days ago)      May 5th It started.   It's getting worse.   I'm not sure where it's coming from. 4. SUDDEN: "Gradual or sudden onset?"     First started it was sudden.   I woke up with it.  I stand at work all day.   I've had this happen before.   The doctor said did not find a UTI. 5. PATTERN "Does the pain come and go, or is it constant?"    - If constant: "Is it getting better, staying the same, or worsening?"      (Note: Constant means the pain never goes away completely; most serious pain is constant and it progresses)     - If intermittent: "How long does it last?" "Do you have pain now?"     (Note: Intermittent  means the pain goes away completely between bouts)     It comes and goes.    6. SEVERITY: "How bad is the pain?"  (e.g., Scale 1-10; mild, moderate, or severe)   - MILD (1-3): doesn't interfere with normal activities, abdomen soft and not tender to touch    - MODERATE (4-7): interferes with normal activities or awakens from sleep, tender to touch    - SEVERE (8-10): excruciating pain, doubled over, unable to do any normal activities      7 on pain scale 7. RECURRENT SYMPTOM: "Have you ever had this type of abdominal pain before?" If so, ask: "When was the last time?" and "What happened that time?"      Yes   See above 8. CAUSE: "What do you think is causing the abdominal pain?"     2 weeks prior to May 5th I drank a lot of alcohol which I don't normally do.   I bought some new sex toys and maybe I hurt myself.    9. RELIEVING/AGGRAVATING FACTORS: "What makes it better or worse?" (e.g., movement, antacids, bowel movement)     When I pee I feel the tenderness on the  right side of my abd.    10. OTHER SYMPTOMS: "Has there been any vomiting, diarrhea, constipation, or urine problems?"       Not had a BM since yesterday and I normally go every day.    Sometimes have problems with constipation. 11. PREGNANCY: "Is there any chance you are pregnant?" "When was your last menstrual period?"       No.  Protocols used: ABDOMINAL PAIN - [facility]

## 2018-03-16 NOTE — Telephone Encounter (Signed)
FYI: Pt was Triaged and is scheduled to see you on 03/20/2018 for Abdominal pain.

## 2018-03-20 ENCOUNTER — Ambulatory Visit (INDEPENDENT_AMBULATORY_CARE_PROVIDER_SITE_OTHER): Payer: 59 | Admitting: Family Medicine

## 2018-03-20 ENCOUNTER — Encounter: Payer: Self-pay | Admitting: Family Medicine

## 2018-03-20 VITALS — BP 102/78 | HR 96 | Temp 99.3°F | Wt 201.4 lb

## 2018-03-20 DIAGNOSIS — N3000 Acute cystitis without hematuria: Secondary | ICD-10-CM | POA: Diagnosis not present

## 2018-03-20 DIAGNOSIS — R300 Dysuria: Secondary | ICD-10-CM | POA: Diagnosis not present

## 2018-03-20 LAB — POC URINALSYSI DIPSTICK (AUTOMATED)
BILIRUBIN UA: NEGATIVE
Glucose, UA: NEGATIVE
KETONES UA: NEGATIVE
NITRITE UA: NEGATIVE
PH UA: 8.5 — AB (ref 5.0–8.0)
PROTEIN UA: NEGATIVE
RBC UA: NEGATIVE
Spec Grav, UA: 1.015 (ref 1.010–1.025)
Urobilinogen, UA: 0.2 E.U./dL

## 2018-03-20 MED ORDER — SULFAMETHOXAZOLE-TRIMETHOPRIM 800-160 MG PO TABS: 1.0000 | ORAL_TABLET | Freq: Two times a day (BID) | ORAL | 0 refills | Status: AC

## 2018-03-20 MED ORDER — FLUCONAZOLE 150 MG PO TABS: 150.0000 mg | ORAL_TABLET | Freq: Once | ORAL | 0 refills | Status: AC

## 2018-03-20 NOTE — Progress Notes (Signed)
Subjective:    Patient ID: Megan Hall, female    DOB: [birth year], 32 y.o.   MRN: [id number]  Chief Complaint  Patient presents with  . Abdominal Pain  . Back Pain  . Urinary Tract Infection  . Generalized Body Aches    HPI Patient was seen today for ongoing concern.  Pt endorses abdominal pain, low back pain, nausea, dysuria, aching in legs since May 5.  Patient thought symptoms would get better after her menses, but they did not.   Pt has tried Azo pills.  Pt endorses holding her urine at work instead of going to the restroom when she has the urge.  Patient also endorses recent sex toy use.  Patient is not sexually active as her fianc died last year.  Patient endorses subjective fever at the beginning of May when her symptoms started.  Past Medical History:  Diagnosis Date  . Fasciculations   . HA (headache)   . Hypothyroidism   . Vitamin D deficiency     Allergies  Allergen Reactions  . Peanuts [Peanut Oil] Swelling    ROS General: Denies fever, chills, night sweats, changes in weight, changes in appetite  +subjective fever-now resolved HEENT: Denies headaches, ear pain, changes in vision, rhinorrhea, sore throat CV: Denies CP, palpitations, SOB, orthopnea Pulm: Denies SOB, cough, wheezing GI: Denies vomiting, diarrhea, constipation  +abd pain, nausea GU: Denies hematuria, frequency, vaginal discharge  +dysuria Msk: Denies muscle cramps, joint pains  +low back pain, ache in legs Neuro: Denies weakness, numbness, tingling Skin: Denies rashes, bruising Psych: Denies depression, anxiety, hallucinations     Objective:    Blood pressure 102/78, pulse 96, temperature 99.3 F (37.4 C), weight 201 lb 6.4 oz (91.4 kg), SpO2 98 %.   Gen. Pleasant, well-nourished, in no distress, normal affect   HEENT: [place]/AT, face symmetric, conjunctiva clear, no scleral icterus, PERRLA, nares patent without drainage. Lungs: no accessory muscle use, CTAB, no wheezes or  rales Cardiovascular: RRR, no m/r/g, no peripheral edema Abdomen: BS present, soft, NT/ND, no hepatosplenomegaly.  No CVA tenderness Neuro:  A&Ox3, CN II-XII intact, normal gait  Wt Readings from Last 3 Encounters:  03/20/18 201 lb 6.4 oz (91.4 kg)  10/26/17 191 lb 11.2 oz (87 kg)  07/11/17 186 lb (84.4 kg)    Lab Results  Component Value Date   WBC 6.5 05/30/2016   HGB 12.1 05/30/2016   HCT 36.3 05/30/2016   PLT 289 05/30/2016   GLUCOSE 97 07/11/2017   NA 140 07/11/2017   K 5.0 07/11/2017   CL 105 07/11/2017   CREATININE 0.75 07/11/2017   BUN 9 07/11/2017   CO2 29 07/11/2017    Assessment/Plan:  Acute cystitis without hematuria  -UA with 1+ leuks, pH 8.5, SG 1.015 -will send for UCx -pt encouraged to increase po intake of fluids. -given rx for bactrim and diflucan for possible yeast infection 2/2 abx use. -given handout - Plan: sulfamethoxazole-trimethoprim (BACTRIM DS,SEPTRA DS) 800-160 MG tablet, fluconazole (DIFLUCAN) 150 MG tablet, Urine Culture  Painful urging to urinate  - Plan: POCT Urinalysis Dipstick (Automated)  F/u prn.  Abbe Amsterdam, MD

## 2018-03-20 NOTE — Patient Instructions (Signed)

## 2018-03-21 LAB — URINE CULTURE
MICRO NUMBER:: 90640518
SPECIMEN QUALITY:: ADEQUATE

## 2018-05-04 ENCOUNTER — Other Ambulatory Visit: Payer: Self-pay | Admitting: Family Medicine

## 2018-05-04 ENCOUNTER — Telehealth: Payer: Self-pay | Admitting: *Deleted

## 2018-05-04 NOTE — Telephone Encounter (Signed)
Copied from CRM [phone]. Topic: Referral - Request >> May 04, 2018  2:06 PM Gerrianne ScalePayne, Angela L wrote: Reason for CRM: patient calling wanting a referral to a Female OBGYN for abd pain, back and pelvic pain shes been nauseated also

## 2018-05-04 NOTE — Telephone Encounter (Signed)
ok 

## 2018-05-04 NOTE — Telephone Encounter (Signed)
Please advise 

## 2018-05-07 ENCOUNTER — Other Ambulatory Visit: Payer: Self-pay | Admitting: Family Medicine

## 2018-05-07 NOTE — Telephone Encounter (Signed)
Pt called and asked if possible she would like to go to [facility] on [address] (not [address]), no particular provider but  pt would prefer a female provider.

## 2018-05-07 NOTE — Telephone Encounter (Signed)
Patient is calling for update on referral .  Please advise

## 2018-05-08 ENCOUNTER — Other Ambulatory Visit: Payer: Self-pay | Admitting: Family Medicine

## 2018-05-08 DIAGNOSIS — R102 Pelvic and perineal pain: Secondary | ICD-10-CM

## 2018-05-08 NOTE — Telephone Encounter (Signed)
Referral has been placed, called pt left a detailed message on pt voicemail.

## 2018-10-11 DIAGNOSIS — Z6831 Body mass index (BMI) 31.0-31.9, adult: Secondary | ICD-10-CM | POA: Diagnosis not present

## 2018-10-11 DIAGNOSIS — R350 Frequency of micturition: Secondary | ICD-10-CM | POA: Diagnosis not present

## 2018-10-11 DIAGNOSIS — Z01419 Encounter for gynecological examination (general) (routine) without abnormal findings: Secondary | ICD-10-CM | POA: Diagnosis not present

## 2019-03-12 ENCOUNTER — Other Ambulatory Visit: Payer: Self-pay

## 2019-03-12 ENCOUNTER — Encounter: Payer: Self-pay | Admitting: Family Medicine

## 2019-03-12 ENCOUNTER — Ambulatory Visit: Payer: Self-pay | Admitting: Family Medicine

## 2019-03-12 ENCOUNTER — Ambulatory Visit (INDEPENDENT_AMBULATORY_CARE_PROVIDER_SITE_OTHER): Payer: 59 | Admitting: Family Medicine

## 2019-03-12 DIAGNOSIS — M5432 Sciatica, left side: Secondary | ICD-10-CM | POA: Diagnosis not present

## 2019-03-12 MED ORDER — CYCLOBENZAPRINE HCL 5 MG PO TABS: 5.0000 mg | ORAL_TABLET | Freq: Three times a day (TID) | ORAL | 0 refills | Status: AC | PRN

## 2019-03-12 NOTE — Progress Notes (Signed)
Virtual Visit via Telephone Note  I connected with [facility] on 03/12/19 at  3:00 PM EDT by telephone and verified that I am speaking with the correct person using two identifiers.   I discussed the limitations, risks, security and privacy concerns of performing an evaluation and management service by telephone and the availability of in person appointments. I also discussed with the patient that there may be a patient responsible charge related to this service. The patient expressed understanding and agreed to proceed.  Location patient: home Location provider: work or home office Participants present for the call: patient, provider Patient did not have a visit in the prior 7 days to address this/these issue(s).   History of Present Illness: Pt rode in a car 10 hours home to [place] 1.5 mo ago.  She started having Tingling/burning in L thigh/butt, tightness in posterior thigh, and pain in L knee.  Pt wore compression socks. Denies edema in L leg or calves, SOB, difficulty ambulating, loss of bowel or bladder, or back pain.  Did notice some edema in ankle after flying back to [facility] that resolved.  Pt notes increased activity while at home, pushing, pulling, lifting her nieces and nephews.  Pt endorses wt gain.  Tired arthritis medicine without relief.   Observations/Objective: Patient sounds cheerful and well on the phone. I do not appreciate any SOB. Speech and thought processing are grossly intact. Patient reported vitals:  Assessment and Plan: Sciatica of left side  -discussed stretching, NSAIDs, heat/ice, massage -given precautions - Plan: cyclobenzaprine (FLEXERIL) 5 MG tablet   Follow Up Instructions:  F/u prn  I did not refer this patient for an OV in the next 24 hours for this/these issue(s).  I discussed the assessment and treatment plan with the patient. The patient was provided an opportunity to ask questions and all were answered. The patient agreed with  the plan and demonstrated an understanding of the instructions.   The patient was advised to call back or seek an in-person evaluation if the symptoms worsen or if the condition fails to improve as anticipated.  I provided 16 minutes of non-face-to-face time during this encounter.   Deeann Saint, MD

## 2019-06-04 ENCOUNTER — Ambulatory Visit: Payer: Self-pay

## 2019-06-04 NOTE — Telephone Encounter (Signed)
Will monitor for arrival at ED

## 2019-06-04 NOTE — Telephone Encounter (Signed)
Pt called stating that her problems started in April after a long car ride.  She states that she saw Dr Volanda Napoleon virtually and was Dx with Sciatica. She say since then her problem has gotten worse.  She has swelling to the ankle and left calf.  She states the pain is 7 and like pins and needles.  She states the left leg is weak.  She sometimes experiences SOB and today when she lies down she has a pinching sensation in her chest.  Per protocol patient will go to ER for evaluation of symptoms. Care advice read to patient. She verbalized understanding of all instructions.  Reason for Disposition . Chest pain  Answer Assessment - Initial Assessment Questions 1. ONSET: "When did the pain start?"      Left calf and leg April 2. LOCATION: "Where is the pain located?"      Left leg ankles is swollen and calf 3. PAIN: "How bad is the pain?"    (Scale 1-10; or mild, moderate, severe)   -  MILD (1-3): doesn't interfere with normal activities    -  MODERATE (4-7): interferes with normal activities (e.g., work or school) or awakens from sleep, limping    -  SEVERE (8-10): excruciating pain, unable to do any normal activities, unable to walk     7 and weak 4. WORK OR EXERCISE: "Has there been any recent work or exercise that involved this part of the body?"      None 5. CAUSE: "What do you think is causing the leg pain?"     Unsure  6. OTHER SYMPTOMS: "Do you have any other symptoms?" (e.g., chest pain, back pain, breathing difficulty, swelling, rash, fever, numbness, weakness)    Weakness to left leg,Chest pain in April very winded, 7. PREGNANCY: "Is there any chance you are pregnant?" "When was your last menstrual period?"    N/A  Protocols used: LEG PAIN-A-AH

## 2019-06-04 NOTE — Telephone Encounter (Signed)
See note

## 2019-08-15 ENCOUNTER — Encounter (HOSPITAL_COMMUNITY): Payer: Self-pay | Admitting: Emergency Medicine

## 2019-08-15 ENCOUNTER — Emergency Department (HOSPITAL_COMMUNITY)
Admission: EM | Admit: 2019-08-15 | Discharge: 2019-08-15 | Disposition: A | Discharge status: Home / Self Care | Payer: 59 | Attending: Emergency Medicine | Admitting: Emergency Medicine

## 2019-08-15 ENCOUNTER — Other Ambulatory Visit: Payer: Self-pay

## 2019-08-15 DIAGNOSIS — F918 Other conduct disorders: Secondary | ICD-10-CM | POA: Diagnosis not present

## 2019-08-15 DIAGNOSIS — R2243 Localized swelling, mass and lump, lower limb, bilateral: Secondary | ICD-10-CM | POA: Insufficient documentation

## 2019-08-15 DIAGNOSIS — F419 Anxiety disorder, unspecified: Secondary | ICD-10-CM | POA: Insufficient documentation

## 2019-08-15 DIAGNOSIS — M5430 Sciatica, unspecified side: Secondary | ICD-10-CM

## 2019-08-15 DIAGNOSIS — M79604 Pain in right leg: Secondary | ICD-10-CM | POA: Diagnosis present

## 2019-08-15 LAB — I-STAT CHEM 8, ED
BUN: 12 mg/dL (ref 6–20)
Calcium, Ion: 1.22 mmol/L (ref 1.15–1.40)
Chloride: 106 mmol/L (ref 98–111)
Creatinine, Ser: 0.7 mg/dL (ref 0.44–1.00)
Glucose, Bld: 87 mg/dL (ref 70–99)
HCT: 35 % — ABNORMAL LOW (ref 36.0–46.0)
Hemoglobin: 11.9 g/dL — ABNORMAL LOW (ref 12.0–15.0)
Potassium: 3.6 mmol/L (ref 3.5–5.1)
Sodium: 140 mmol/L (ref 135–145)
TCO2: 24 mmol/L (ref 22–32)

## 2019-08-15 LAB — I-STAT BETA HCG BLOOD, ED (MC, WL, AP ONLY): I-stat hCG, quantitative: <5 m[IU]/mL (ref <5)

## 2019-08-15 MED ORDER — ACETAMINOPHEN 500 MG PO TABS
1000.0000 mg | ORAL_TABLET | Freq: Once | ORAL | Status: AC
  Administered 2019-08-15: 1000 mg via ORAL
  Filled 2019-08-15: qty 2

## 2019-08-15 MED ORDER — NAPROXEN 375 MG PO TABS: 375.0000 mg | ORAL_TABLET | Freq: Two times a day (BID) | ORAL | 0 refills | Status: AC

## 2019-08-15 MED ORDER — NAPROXEN 500 MG PO TABS
500.0000 mg | ORAL_TABLET | ORAL | Status: AC
  Administered 2019-08-15: 500 mg via ORAL
  Filled 2019-08-15: qty 1

## 2019-08-15 NOTE — ED Provider Notes (Signed)
[facility] DEPT Provider Note   CSN: [id number] Arrival date & time: 08/15/19  1733     History   Chief Complaint Chief Complaint  Patient presents with  . Leg Pain  . Joint Swelling    HPI Megan Hall is a 33 y.o. female.     The history is provided by the patient.  Leg Pain Location:  Leg Time since incident:  6 months Injury: no   Leg location:  R leg and L leg Pain details:    Quality:  Burning (starts in the buttocks when she bends and radiates down. )   Severity:  Moderate   Onset quality:  Sudden   Duration:  6 months   Progression:  Waxing and waning Chronicity:  Chronic Dislocation: no   Foreign body present:  No foreign bodies Prior injury to area:  No Relieved by:  Nothing Worsened by:  Flexion Ineffective treatments:  None tried Associated symptoms: no decreased ROM, no fatigue, no fever, no itching, no muscle weakness, no neck pain, no numbness and no stiffness   Patient has been having buttock pain and BLE pain since Megan Hall with trip to [place].  Worse with bending and twisting told by PMD it was likely sciatica but referred in to the ED months ago but decided not to come. Reports ankles swollen for months.  No CP, no DOE. No SOB.  No calf pain or swelling.  No OCP.  No weakness nor numbness no difficulty with gait.  No bowel or bladder incontinence no pelvic numbness.    History reviewed. No pertinent past medical history.  There are no active problems to display for this patient.   History reviewed. No pertinent surgical history.   OB History   No obstetric history on file.      Home Medications    Prior to Admission medications   Not on File    Family History No family history on file.  Social History Social History   Tobacco Use  . Smoking status: Not on file  Substance Use Topics  . Alcohol use: Yes  . Drug use: Not on file     Allergies   Other   Review of Systems Review of Systems   Constitutional: Negative for fatigue and fever.  HENT: Negative for congestion.   Eyes: Negative for visual disturbance.  Respiratory: Negative for cough, chest tightness and shortness of breath.   Cardiovascular: Negative for chest pain and palpitations.  Gastrointestinal: Negative for abdominal pain.  Genitourinary: Negative for difficulty urinating and genital sores.  Musculoskeletal: Negative for gait problem, neck pain and stiffness.  Skin: Negative for itching.  Neurological: Negative for dizziness, weakness and numbness.  Psychiatric/Behavioral: Negative for decreased concentration.  All other systems reviewed and are negative.    Physical Exam Updated Vital Signs BP 127/77   Pulse 86   Temp 97.9 F (36.6 C) (Oral)   Resp 15   LMP 08/06/2019   SpO2 100%   Physical Exam Vitals signs and nursing note reviewed.  Constitutional:      General: She is not in acute distress.    Appearance: She is normal weight.  HENT:     Head: Normocephalic and atraumatic.     Nose: Nose normal.     Mouth/Throat:     Mouth: Mucous membranes are moist.     Pharynx: Oropharynx is clear.  Eyes:     Conjunctiva/sclera: Conjunctivae normal.     Pupils: Pupils are equal, round, and  reactive to light.  Neck:     Musculoskeletal: Normal range of motion and neck supple.  Cardiovascular:     Rate and Rhythm: Normal rate and regular rhythm.     Pulses: Normal pulses.     Heart sounds: Normal heart sounds.  Pulmonary:     Effort: Pulmonary effort is normal.     Breath sounds: Normal breath sounds.  Abdominal:     General: Abdomen is flat. Bowel sounds are normal.     Tenderness: There is no abdominal tenderness. There is no guarding.  Musculoskeletal: Normal range of motion.        General: No swelling or tenderness.     Right hip: Normal.     Left hip: Normal.     Right knee: Normal.     Left knee: Normal.     Right ankle: Normal. Achilles tendon normal.     Left ankle: Normal.  Achilles tendon normal.     Cervical back: Normal.     Thoracic back: Normal.     Lumbar back: Normal.     Right upper leg: Normal.     Left upper leg: Normal.     Right lower leg: No edema.     Left lower leg: No edema.     Right foot: Normal.     Left foot: Normal.     Comments: Negative Homan's sign.  There is no edema, no swelling of the ankles.    Skin:    General: Skin is warm and dry.     Capillary Refill: Capillary refill takes less than 2 seconds.     Findings: No rash.  Neurological:     General: No focal deficit present.     Mental Status: She is alert and oriented to person, place, and time.     Sensory: No sensory deficit.     Motor: No weakness.     Gait: Gait normal.     Deep Tendon Reflexes: Reflexes normal.     Comments: Gait tested and is normal.  Intact L5s1 5/5 BLE strength  Psychiatric:        Mood and Affect: Mood is anxious.        Behavior: Behavior is aggressive.      ED Treatments / Results  Labs (all labs ordered are listed, but only abnormal results are displayed) Results for orders placed or performed during the hospital encounter of 08/15/19  I-Stat Beta hCG blood, ED ([facility], [facility], [facility] only)  Result Value Ref Range   I-stat hCG, quantitative <5.0 <5 mIU/mL   Comment 3          I-stat chem 8, ED (not at [facility] or [facility])  Result Value Ref Range   Sodium 140 135 - 145 mmol/L   Potassium 3.6 3.5 - 5.1 mmol/L   Chloride 106 98 - 111 mmol/L   BUN 12 6 - 20 mg/dL   Creatinine, Ser 3.76 0.44 - 1.00 mg/dL   Glucose, Bld 87 70 - 99 mg/dL   Calcium, Ion 2.83 1.51 - 1.40 mmol/L   TCO2 24 22 - 32 mmol/L   Hemoglobin 11.9 (L) 12.0 - 15.0 g/dL   HCT 76.1 (L) 60.7 - 37.1 %   No results found.  Radiology No results found.  Procedures Procedures (including critical care time)  Medications Ordered in ED Medications  naproxen (NAPROSYN) tablet 500 mg (has no administration in time range)  acetaminophen (TYLENOL) tablet 1,000 mg (has no administration in  time range)  Initial Impression / Assessment and Plan / ED Course  Symptoms consistent with sciatica.  Patient has not done anything for her symptoms in 6 months.  She is angry that no one told her we do not have dopplers at night.  She is angry that there was a wait at all.  EDP apologized for this and explained we would start high dose NSAIDs and schedule for am. She has intact gait and no changes in bowel or bladder function.  No signs of cauda equina.  No red flags, no indication for imaging of the spine at this time. I do not believe the patient has a DVT.  Moreover symptoms are ongoing without CP or SoB for 6 months.  I have advised NSAIDS and compression stockings. Patient agrees to follow up for outpatient dopplers in the AM.  Strict return precautions given.    Levert FeinsteinBrittney Fabry was evaluated in Emergency Department on 08/16/2019 for the symptoms described in the history of present illness. She was evaluated in the context of the global COVID-19 pandemic, which necessitated consideration that the patient might be at risk for infection with the SARS-CoV-2 virus that causes COVID-19. Institutional protocols and algorithms that pertain to the evaluation of patients at risk for COVID-19 are in a state of rapid change based on information released by regulatory bodies including the CDC and federal and state organizations. These policies and algorithms were followed during the patient's care in the ED.   Final Clinical Impressions(s) / ED Diagnoses   Return for weakness, numbness, changes in vision or speech, fevers >100.4 unrelieved by medication, shortness of breath, intractable vomiting, or diarrhea, abdominal pain, Inability to tolerate liquids or food, cough, altered mental status or any concerns. No signs of systemic illness or infection. The patient is nontoxic-appearing on exam and vital signs are within normal limits.   I have reviewed the triage vital signs and the nursing notes.  Pertinent labs &imaging results that were available during my care of the patient were reviewed by me and considered in my medical decision making (see chart for details).  After history, exam, and medical workup I feel the patient has been appropriately medically screened and is safe for discharge home. Pertinent diagnoses were discussed with the patient. Patient was given return precautions      Lavonne Kinderman, MD 08/16/19 [id number]

## 2019-08-15 NOTE — ED Triage Notes (Signed)
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  Reports that when she started having bilat ankle swelling and left leg tingling and burning sensations.

## 2019-08-16 ENCOUNTER — Encounter: Payer: Self-pay | Admitting: Family Medicine

## 2019-08-16 ENCOUNTER — Ambulatory Visit (HOSPITAL_COMMUNITY): Admission: RE | Admit: 2019-08-16 | Payer: 59 | Source: Ambulatory Visit

## 2019-08-17 ENCOUNTER — Encounter (HOSPITAL_COMMUNITY): Payer: 59

## 2019-08-21 ENCOUNTER — Ambulatory Visit (HOSPITAL_COMMUNITY)
Admission: RE | Admit: 2019-08-21 | Discharge: 2019-08-21 | Disposition: A | Discharge status: Home / Self Care | Payer: 59 | Source: Ambulatory Visit | Attending: Emergency Medicine | Admitting: Emergency Medicine

## 2019-08-21 ENCOUNTER — Other Ambulatory Visit: Payer: Self-pay

## 2019-08-21 DIAGNOSIS — R52 Pain, unspecified: Secondary | ICD-10-CM | POA: Diagnosis not present

## 2019-08-21 DIAGNOSIS — M79661 Pain in right lower leg: Secondary | ICD-10-CM | POA: Insufficient documentation

## 2019-08-21 DIAGNOSIS — M79662 Pain in left lower leg: Secondary | ICD-10-CM | POA: Diagnosis not present

## 2019-08-21 NOTE — Progress Notes (Signed)
Lower extremity venous has been completed.   Preliminary results in CV Proc.   Abram Sander 08/21/2019 1:30 PM

## 2020-09-04 ENCOUNTER — Telehealth: Payer: Self-pay

## 2020-09-04 NOTE — Telephone Encounter (Signed)
Left a detailed message for pt to call the office and provide more information regarding her Medical release form completing.
